# Patient Record
Sex: Male | Born: 1985
Health system: Southern US, Community
[De-identification: ages and names within clinical notes are randomized; demographics above are authoritative.]

## PROBLEM LIST (undated history)

## (undated) DIAGNOSIS — F419 Anxiety disorder, unspecified: Secondary | ICD-10-CM

## (undated) DIAGNOSIS — E039 Hypothyroidism, unspecified: Secondary | ICD-10-CM

## (undated) HISTORY — DX: Anxiety disorder, unspecified: F41.9

---

## 2005-02-12 ENCOUNTER — Emergency Department (HOSPITAL_COMMUNITY): Admission: EM | Admit: 2005-02-12 | Discharge: 2005-02-12 | Payer: Self-pay | Admitting: Emergency Medicine

## 2009-02-08 ENCOUNTER — Inpatient Hospital Stay: Payer: Self-pay | Admitting: Psychiatry

## 2009-02-09 ENCOUNTER — Ambulatory Visit: Payer: Self-pay | Admitting: Cardiology

## 2012-05-15 ENCOUNTER — Emergency Department: Payer: Self-pay | Admitting: Unknown Physician Specialty

## 2012-05-16 ENCOUNTER — Emergency Department: Payer: Self-pay | Admitting: Emergency Medicine

## 2012-05-26 ENCOUNTER — Emergency Department: Payer: Self-pay | Admitting: *Deleted

## 2017-02-06 ENCOUNTER — Emergency Department: Payer: Self-pay

## 2017-02-06 ENCOUNTER — Emergency Department
Admission: EM | Admit: 2017-02-06 | Discharge: 2017-02-06 | Disposition: A | Discharge status: Home / Self Care | Payer: Self-pay | Attending: Emergency Medicine | Admitting: Emergency Medicine

## 2017-02-06 ENCOUNTER — Encounter: Payer: Self-pay | Admitting: *Deleted

## 2017-02-06 DIAGNOSIS — F17210 Nicotine dependence, cigarettes, uncomplicated: Secondary | ICD-10-CM | POA: Insufficient documentation

## 2017-02-06 DIAGNOSIS — R42 Dizziness and giddiness: Secondary | ICD-10-CM | POA: Insufficient documentation

## 2017-02-06 LAB — CBC
HEMATOCRIT: 44.2 % (ref 40.0–52.0)
HEMOGLOBIN: 15 g/dL (ref 13.0–18.0)
MCH: 30.9 pg (ref 26.0–34.0)
MCHC: 34 g/dL (ref 32.0–36.0)
MCV: 91.1 fL (ref 80.0–100.0)
Platelets: 259 10*3/uL (ref 150–440)
RBC: 4.85 MIL/uL (ref 4.40–5.90)
RDW: 13.3 % (ref 11.5–14.5)
WBC: 7.7 10*3/uL (ref 3.8–10.6)

## 2017-02-06 LAB — URINALYSIS, COMPLETE (UACMP) WITH MICROSCOPIC
BACTERIA UA: NONE SEEN
Bilirubin Urine: NEGATIVE
Glucose, UA: NEGATIVE mg/dL
Hgb urine dipstick: NEGATIVE
Ketones, ur: 5 mg/dL — AB
Leukocytes, UA: NEGATIVE
Nitrite: NEGATIVE
PROTEIN: NEGATIVE mg/dL
Specific Gravity, Urine: 1.025 (ref 1.005–1.030)
Squamous Epithelial / LPF: NONE SEEN
pH: 6 (ref 5.0–8.0)

## 2017-02-06 LAB — BASIC METABOLIC PANEL
ANION GAP: 7 (ref 5–15)
BUN: 16 mg/dL (ref 6–20)
CALCIUM: 8.9 mg/dL (ref 8.9–10.3)
CO2: 27 mmol/L (ref 22–32)
Chloride: 104 mmol/L (ref 101–111)
Creatinine, Ser: 1.07 mg/dL (ref 0.61–1.24)
GFR calc Af Amer: >60 mL/min (ref >60)
GFR calc non Af Amer: >60 mL/min (ref >60)
GLUCOSE: 114 mg/dL — AB (ref 65–99)
POTASSIUM: 3.5 mmol/L (ref 3.5–5.1)
Sodium: 138 mmol/L (ref 135–145)

## 2017-02-06 MED ORDER — MECLIZINE HCL 25 MG PO TABS: 25.0000 mg | ORAL_TABLET | Freq: Three times a day (TID) | ORAL | 0 refills | Status: DC | PRN

## 2017-02-06 NOTE — ED Notes (Signed)
Pt c/o dizziness, usually when sitting. Pt states it is more often in morning after waking and when riding in a car. Pt c/o infrequent nausea w/ dizziness. Pt states he has had this x 3-4 months. Pt states no precipitating factors other than position change or seated position. Pt c/o intermittently blurred vision w/ dizziness. Pt c/o ringing in ears when dizzy that is intermittent. Pt in no acute distress at this time.

## 2017-02-06 NOTE — ED Provider Notes (Signed)
[facility] Emergency Department Provider Note   ____________________________________________    I have reviewed the triage vital signs and the nursing notes.   HISTORY  Chief Complaint Dizziness     HPI Austin Fletcher is a 31 y.o. male who complains of intermittent episodes of dizziness over the last 4 months. They seem to happen sporadically and he reports typically when sitting down. He describes a sensation of the room spinning briefly but seems to go away when he focuses on things. No ear pain or tinnitus. No fevers or chills. No chest pain or palpitations. No shortness of breath. He was seen at [facility] ED in the past for this. Currently feels quite well with no symptoms   History reviewed. No pertinent past medical history.  There are no active problems to display for this patient.   History reviewed. No pertinent surgical history.  Prior to Admission medications   Medication Sig Start Date End Date Taking? Authorizing Provider  meclizine (ANTIVERT) 25 MG tablet Take 1 tablet (25 mg total) by mouth 3 (three) times daily as needed for dizziness. 02/06/17   Jene Every, MD     Allergies Patient has no known allergies.  No family history on file.  Social History Social History  Substance Use Topics  . Smoking status: Current Every Day Smoker    Packs/day: 1.00    Types: Cigarettes  . Smokeless tobacco: Current User  . Alcohol use No    Review of Systems  Constitutional: No fever/chills Eyes: No visual changes.  ENT: NoNeck pain Cardiovascular: Denies chest pain. Respiratory: Denies shortness of breath. Gastrointestinal: No abdominal pain.  No nausea, no vomiting.    Musculoskeletal: Negative for back pain. Skin: Negative for rash. Neurological: Negative for headaches or focal weakness  10-point ROS otherwise negative.  ____________________________________________   PHYSICAL EXAM:  VITAL SIGNS: ED Triage Vitals  Enc  Vitals Group     BP 02/06/17 1908 123/80     Pulse Rate 02/06/17 1908 61     Resp 02/06/17 1908 18     Temp 02/06/17 1908 99.1 F (37.3 C)     Temp Source 02/06/17 1908 Oral     SpO2 02/06/17 1908 100 %     Weight 02/06/17 1909 170 lb (77.1 kg)     Height 02/06/17 1909 6' (1.829 m)     Head Circumference --      Peak Flow --      Pain Score --      Pain Loc --      Pain Edu? --      Excl. in GC? --     Constitutional: Alert and oriented. No acute distress. Pleasant and interactive Eyes: Conjunctivae are normal. PERRLA, EOMI Head: Atraumatic. Nose: No congestion/rhinnorhea. Mouth/Throat: Mucous membranes are moist.   Neck:  Painless ROM Cardiovascular: Normal rate, regular rhythm. Grossly normal heart sounds.  Good peripheral circulation. Respiratory: Normal respiratory effort.  No retractions. Lungs CTAB.  Genitourinary: deferred Musculoskeletal: No lower extremity tenderness nor edema.  Warm and well perfused Neurologic:  Normal speech and language. No gross focal neurologic deficits are appreciated. Cranial nerves II-12 are normal Skin:  Skin is warm, dry and intact. No rash noted. Psychiatric: Mood and affect are normal. Speech and behavior are normal.  ____________________________________________   LABS (all labs ordered are listed, but only abnormal results are displayed)  Labs Reviewed  BASIC METABOLIC PANEL - Abnormal; Notable for the following:  Result Value   Glucose, Bld 114 (*)    All other components within normal limits  URINALYSIS, COMPLETE (UACMP) WITH MICROSCOPIC - Abnormal; Notable for the following:    Color, Urine YELLOW (*)    APPearance CLEAR (*)    Ketones, ur 5 (*)    All other components within normal limits  CBC   ____________________________________________  EKG  ED ECG REPORT I, Jene Every, the attending physician, personally viewed and interpreted this ECG.  Date: 02/06/2017  Rhythm: normal sinus rhythm QRS Axis:  normal Intervals: normal ST/T Wave abnormalities: normal Conduction Disturbances: none Narrative Interpretation: unremarkable  ____________________________________________  RADIOLOGY  CT head unremarkable ____________________________________________   PROCEDURES  Procedure(s) performed: No    Critical Care performed: No ____________________________________________   INITIAL IMPRESSION / ASSESSMENT AND PLAN / ED COURSE  Pertinent labs & imaging results that were available during my care of the patient were reviewed by me and considered in my medical decision making (see chart for details).  Patient presents with intermittent episodes of dizziness over several months. Currently asymptomatic. Description is most consistent with vertigo. Lab work is reassuring, CT head is unremarkable. I will prescribe meclizine and encouraged follow-up with ENT for further workup of this    ____________________________________________   FINAL CLINICAL IMPRESSION(S) / ED DIAGNOSES  Final diagnoses:  Vertigo      NEW MEDICATIONS STARTED DURING THIS VISIT:  Discharge Medication List as of 02/06/2017  8:53 PM    START taking these medications   Details  meclizine (ANTIVERT) 25 MG tablet Take 1 tablet (25 mg total) by mouth 3 (three) times daily as needed for dizziness., Starting Mon 02/06/2017, Print         Note:  This document was prepared using Dragon voice recognition software and may include unintentional dictation errors.    Jene Every, MD 02/06/17 2140

## 2017-02-06 NOTE — ED Triage Notes (Signed)
Pt complains of intermittent dizziness for the last 3 months, pt complains of an occasional headache, pt denies any other symptoms

## 2017-08-01 ENCOUNTER — Emergency Department
Admission: EM | Admit: 2017-08-01 | Discharge: 2017-08-01 | Disposition: A | Discharge status: Home / Self Care | Payer: Self-pay | Attending: Emergency Medicine | Admitting: Emergency Medicine

## 2017-08-01 ENCOUNTER — Emergency Department: Payer: Self-pay

## 2017-08-01 ENCOUNTER — Encounter: Payer: Self-pay | Admitting: Emergency Medicine

## 2017-08-01 DIAGNOSIS — R42 Dizziness and giddiness: Secondary | ICD-10-CM | POA: Insufficient documentation

## 2017-08-01 DIAGNOSIS — H6123 Impacted cerumen, bilateral: Secondary | ICD-10-CM | POA: Insufficient documentation

## 2017-08-01 DIAGNOSIS — R079 Chest pain, unspecified: Secondary | ICD-10-CM | POA: Insufficient documentation

## 2017-08-01 DIAGNOSIS — F17210 Nicotine dependence, cigarettes, uncomplicated: Secondary | ICD-10-CM | POA: Insufficient documentation

## 2017-08-01 LAB — BASIC METABOLIC PANEL
Anion gap: 8 (ref 5–15)
BUN: 17 mg/dL (ref 6–20)
CALCIUM: 9.3 mg/dL (ref 8.9–10.3)
CO2: 28 mmol/L (ref 22–32)
CREATININE: 1.43 mg/dL — AB (ref 0.61–1.24)
Chloride: 104 mmol/L (ref 101–111)
GFR calc non Af Amer: >60 mL/min (ref >60)
Glucose, Bld: 102 mg/dL — ABNORMAL HIGH (ref 65–99)
Potassium: 4.1 mmol/L (ref 3.5–5.1)
SODIUM: 140 mmol/L (ref 135–145)

## 2017-08-01 LAB — CBC
HCT: 43.1 % (ref 40.0–52.0)
Hemoglobin: 14.7 g/dL (ref 13.0–18.0)
MCH: 30.1 pg (ref 26.0–34.0)
MCHC: 34.1 g/dL (ref 32.0–36.0)
MCV: 88.2 fL (ref 80.0–100.0)
PLATELETS: 242 10*3/uL (ref 150–440)
RBC: 4.89 MIL/uL (ref 4.40–5.90)
RDW: 13.3 % (ref 11.5–14.5)
WBC: 7.3 10*3/uL (ref 3.8–10.6)

## 2017-08-01 LAB — TROPONIN I

## 2017-08-01 MED ORDER — PENICILLIN G BENZATHINE & PROC 1200000 UNIT/2ML IM SUSP: 1.2000 10*6.[IU] | Freq: Once | INTRAMUSCULAR | Status: DC

## 2017-08-01 MED ORDER — KETOROLAC TROMETHAMINE 30 MG/ML IJ SOLN: 30.0000 mg | Freq: Once | INTRAMUSCULAR | Status: DC

## 2017-08-01 NOTE — ED Provider Notes (Addendum)
[facility] Emergency Department Provider Note  ____________________________________________   First MD Initiated Contact with Patient 08/01/17 2044     (approximate)  I have reviewed the triage vital signs and the nursing notes.   HISTORY  Chief Complaint Chest Pain   HPI Austin Fletcher is a 31 y.o. male with a history of recurrent chest pain as well as dizziness over the past year who is presenting to the emergency department today with chest pain and dizziness. He is denying any chest pain at this time and says that his dizziness, which feels like a spinning sensation, worsens when he walks. He says he has had this off and on over the past year and has tried meclizine which made him very drowsy. Has had a CAT scan of his brain which did not show any disease. Has also had chest pain over the past several months which she says is to the center of his chest and radiates to the left side of his chest. He says the pain comes and goes and can last for about a half an hour to an hour. He describes it as sharp and a twisting pain. He denies any worsening with activity. Says that it is sometimes associated with shortness of breath or nausea. However, he denies any vomiting. He says that the chest pain and the dizziness are separate issues and do not frequent become together. He denies any ringing in his ears, hearing loss or pressure in his ears bilaterally. He says that he has stopped smoking as of 2 months ago. However, he says that he has a brother who is 34 years old was at a heart attack.patient says that he is chest pain-free at this time.   History reviewed. No pertinent past medical history.  There are no active problems to display for this patient.   History reviewed. No pertinent surgical history.  Prior to Admission medications   Medication Sig Start Date End Date Taking? Authorizing Provider  meclizine (ANTIVERT) 25 MG tablet Take 1 tablet (25 mg total)  by mouth 3 (three) times daily as needed for dizziness. 02/06/17   Jene Every, MD    Allergies Patient has no known allergies.  History reviewed. No pertinent family history.  Social History Social History  Substance Use Topics  . Smoking status: Current Every Day Smoker    Packs/day: 1.00    Types: Cigarettes  . Smokeless tobacco: Current User  . Alcohol use No    Review of Systems  Constitutional: No fever/chills Eyes: No visual changes. ENT: No sore throat. Cardiovascular:as above. Respiratory: as above Gastrointestinal: No abdominal pain.   no vomiting.  No diarrhea.  No constipation. Genitourinary: Negative for dysuria. Musculoskeletal: Negative for back pain. Skin: Negative for rash. Neurological: Negative for headaches, focal weakness or numbness.   ____________________________________________   PHYSICAL EXAM:  VITAL SIGNS: ED Triage Vitals [08/01/17 2026]  Enc Vitals Group     BP 129/90     Pulse Rate 60     Resp 16     Temp 98 F (36.7 C)     Temp Source Oral     SpO2 99 %     Weight 170 lb (77.1 kg)     Height      Head Circumference      Peak Flow      Pain Score      Pain Loc      Pain Edu?      Excl. in GC?  Constitutional: Alert and oriented. Well appearing and in no acute distress. Eyes: Conjunctivae are normal.  Head: Atraumatic.bilateral cerumen impaction. Nose: No congestion/rhinnorhea. Mouth/Throat: Mucous membranes are moist.  Neck: No stridor.   Cardiovascular: Normal rate, regular rhythm. Grossly normal heart sounds.  Good peripheral circulation with equal and bilateral radial as well as dorsalis pedis pulses. Chest pain is not reproducible palpation. Respiratory: Normal respiratory effort.  No retractions. Lungs CTAB. Gastrointestinal: Soft and nontender. No distention.  Musculoskeletal: No lower extremity tenderness nor edema.  No joint effusions. Neurologic:  Normal speech and language. No gross focal neurologic deficits  are appreciated.patient able to ambulate throughout his exam room with a normal gait, unassisted.no nystagmus. No ataxia on finger to nose testing. Skin:  Skin is warm, dry and intact. No rash noted. Psychiatric: Mood and affect are normal. Speech and behavior are normal.  ____________________________________________   LABS (all labs ordered are listed, but only abnormal results are displayed)  Labs Reviewed  BASIC METABOLIC PANEL - Abnormal; Notable for the following:       Result Value   Glucose, Bld 102 (*)    Creatinine, Ser 1.43 (*)    All other components within normal limits  CBC  TROPONIN I   ____________________________________________  EKG  ED ECG REPORT I, Arelia Longest, the attending physician, personally viewed and interpreted this ECG.   Date: 08/01/2017  EKG Time: 20/30  Rate: 49  Rhythm: sinus bradycardia  Axis: normal  Intervals:none  ST&T Change: no ST segment elevation or depression. No abnormal T-wave inversion. voltage criteria for LVH. no significant change from previous EKGs  ____________________________________________  RADIOLOGY  no acute process ____________________________________________   PROCEDURES  Procedure(s) performed:  Ceruminosis is noted.  Wax is removed by syringing and manual debridement bilaterally. Instructions for home care to prevent wax buildup are given.   Procedures  Critical Care performed:   ____________________________________________   INITIAL IMPRESSION / ASSESSMENT AND PLAN / ED COURSE  Pertinent labs & imaging results that were available during my care of the patient were reviewed by me and considered in my medical decision making (see chart for details).  PERC negative.    patient with ongoing symptoms for months to years. Very reassuring workup. Bradycardic but without ischemic change and reassuring lab work, x-ray as well as exam. Patient will be discharged with cardiology as well as ENT  follow-up. Small amount of bleeding with wax removal appears to have stopped.cerumen impactions possible cause for the vertigo. patient says that he is concerned that he may be having heartburn causing his chest pain. He says that sometimes he belches it becomes nauseous with it. We will try him on Zantac.      ____________________________________________   FINAL CLINICAL IMPRESSION(S) / ED DIAGNOSES  vertigo. Chest pain.Cerumen impaction.    NEW MEDICATIONS STARTED DURING THIS VISIT:  New Prescriptions   No medications on file     Note:  This document was prepared using Dragon voice recognition software and may include unintentional dictation errors.      Myrna Blazer, MD 08/01/17 2147    Myrna Blazer, MD 08/01/17 [phone]

## 2017-08-01 NOTE — ED Triage Notes (Signed)
Pt c/o intermittent left sided chest pain that radiates into left arm, occurring over 1 year and in the last "dew" days has worsened with intensity and is occurring more frequent. Pt denies SOB, N/V with chest pain episodes. Pt reports episodes of dizziness in last 24 hours. Pt has been seen for symptoms and had work ups in past for current symptoms.

## 2017-08-20 ENCOUNTER — Encounter: Payer: Self-pay | Admitting: Emergency Medicine

## 2017-08-20 ENCOUNTER — Emergency Department: Payer: Self-pay

## 2017-08-20 ENCOUNTER — Emergency Department
Admission: EM | Admit: 2017-08-20 | Discharge: 2017-08-20 | Disposition: A | Discharge status: Home / Self Care | Payer: Self-pay | Attending: Emergency Medicine | Admitting: Emergency Medicine

## 2017-08-20 DIAGNOSIS — Z79899 Other long term (current) drug therapy: Secondary | ICD-10-CM | POA: Insufficient documentation

## 2017-08-20 DIAGNOSIS — R079 Chest pain, unspecified: Secondary | ICD-10-CM | POA: Insufficient documentation

## 2017-08-20 DIAGNOSIS — F17210 Nicotine dependence, cigarettes, uncomplicated: Secondary | ICD-10-CM | POA: Insufficient documentation

## 2017-08-20 LAB — CBC
HEMATOCRIT: 47.2 % (ref 40.0–52.0)
Hemoglobin: 15.9 g/dL (ref 13.0–18.0)
MCH: 30.3 pg (ref 26.0–34.0)
MCHC: 33.6 g/dL (ref 32.0–36.0)
MCV: 90 fL (ref 80.0–100.0)
PLATELETS: 252 10*3/uL (ref 150–440)
RBC: 5.24 MIL/uL (ref 4.40–5.90)
RDW: 13.9 % (ref 11.5–14.5)
WBC: 6.6 10*3/uL (ref 3.8–10.6)

## 2017-08-20 LAB — BASIC METABOLIC PANEL
Anion gap: 6 (ref 5–15)
BUN: 19 mg/dL (ref 6–20)
CHLORIDE: 105 mmol/L (ref 101–111)
CO2: 29 mmol/L (ref 22–32)
CREATININE: 1.33 mg/dL — AB (ref 0.61–1.24)
Calcium: 9.3 mg/dL (ref 8.9–10.3)
GFR calc Af Amer: >60 mL/min (ref >60)
GFR calc non Af Amer: >60 mL/min (ref >60)
GLUCOSE: 124 mg/dL — AB (ref 65–99)
POTASSIUM: 4.2 mmol/L (ref 3.5–5.1)
SODIUM: 140 mmol/L (ref 135–145)

## 2017-08-20 LAB — TROPONIN I: Troponin I: <0.03 ng/mL (ref <0.03)

## 2017-08-20 NOTE — ED Triage Notes (Signed)
Pt c/o intermittent left side chest pain X 2 months. Afraid having heart attack because brother did at young age. Been seen here and [facility] for same without dx.  NAD. ambulatory without difficulty. Respirations unlabored.

## 2017-08-20 NOTE — ED Notes (Addendum)
Pt back from x-ray.

## 2017-08-20 NOTE — ED Provider Notes (Signed)
[facility] Emergency Department Provider Note  ____________________________________________   First MD Initiated Contact with Patient 08/20/17 1601     (approximate)  I have reviewed the triage vital signs and the nursing notes.   HISTORY  Chief Complaint Chest Pain   HPI Austin Fletcher is a 31 y.o. male with a family history of heart disease was presenting to the emergency department today with chest pain. He has had episodes of chest pain on and off over the past year. He has had multiple ER visits including to this ER with very reassuring workups. However, is concerned today because of shortness of breath over the past week which comes and goes with this chest pain. He says that his chest pain is a 4 out of 10 at this time and feels like a squeezing pain on the left side of his chest radiating to the center of his chest. He denies any back pain or left upper extremity pain. He denies any exacerbating factors and says that the pain can come and go when he is at rest or active. He denies any diaphoresis. Says that the chest pain this week also feels like "something is pumping into my heart." He elaborates that it feels like "something feels like it is pumping blood into my heart."  Also complaining of throat pain to the service of his throat just to the right of his thyroid cartilage. Denies any trauma. Denies any cough.   History reviewed. No pertinent past medical history.  There are no active problems to display for this patient.   History reviewed. No pertinent surgical history.  Prior to Admission medications   Medication Sig Start Date End Date Taking? Authorizing Provider  meclizine (ANTIVERT) 25 MG tablet Take 1 tablet (25 mg total) by mouth 3 (three) times daily as needed for dizziness. 02/06/17   Jene Every, MD    Allergies Patient has no known allergies.  History reviewed. No pertinent family history.  Social History Social History    Substance Use Topics  . Smoking status: Current Every Day Smoker    Packs/day: 1.00    Types: Cigarettes  . Smokeless tobacco: Current User     Comment: states stopped 3 weeks ago  . Alcohol use No    Review of Systems  Constitutional: No fever/chills Eyes: No visual changes. ENT:as above Cardiovascular: as above Respiratory: Denies shortness of breath. Gastrointestinal: No abdominal pain.  No nausea, no vomiting.  No diarrhea.  No constipation. Genitourinary: Negative for dysuria. Musculoskeletal: Negative for back pain. Skin: Negative for rash. Neurological: Negative for headaches, focal weakness or numbness.   ____________________________________________   PHYSICAL EXAM:  VITAL SIGNS: ED Triage Vitals  Enc Vitals Group     BP 08/20/17 1536 (!) 144/80     Pulse Rate 08/20/17 1536 60     Resp 08/20/17 1536 12     Temp 08/20/17 1536 98.4 F (36.9 C)     Temp Source 08/20/17 1536 Oral     SpO2 08/20/17 1536 100 %     Weight 08/20/17 1529 170 lb (77.1 kg)     Height --      Head Circumference --      Peak Flow --      Pain Score 08/20/17 1530 6     Pain Loc --      Pain Edu? --      Excl. in GC? --     Constitutional: Alert and oriented. Well appearing and in  no acute distress. Eyes: Conjunctivae are normal.  Head: Atraumatic. Nose: No congestion/rhinnorhea. Mouth/Throat: Mucous membranes are moist. normal pharyngeal exam without any swelling or erythema. Neck: No stridor.  small amount of ecchymosis over the right side of the throat just lateral to the thyroid cartilage. Cardiovascular: Normal rate, regular rhythm. Grossly normal heart sounds.  Good peripheral circulation with equal and bilateral radial pulses. Chest pain is not reproducible palpation. Respiratory: Normal respiratory effort.  No retractions. Lungs CTAB. Gastrointestinal: Soft and nontender. No distention.  Musculoskeletal: No lower extremity tenderness nor edema.  No joint  effusions. Neurologic:  Normal speech and language. No gross focal neurologic deficits are appreciated. Skin:  Skin is warm, dry and intact. No rash noted. Psychiatric: Mood and affect are normal. Speech and behavior are normal.  ____________________________________________   LABS (all labs ordered are listed, but only abnormal results are displayed)  Labs Reviewed  BASIC METABOLIC PANEL - Abnormal; Notable for the following:       Result Value   Glucose, Bld 124 (*)    Creatinine, Ser 1.33 (*)    All other components within normal limits  CBC  TROPONIN I   ____________________________________________  EKG  ED ECG REPORT I, Arelia Longest, the attending physician, personally viewed and interpreted this ECG.   Date: 08/20/2017  EKG Time: 1526  Rate: 55  Rhythm: sinus bradycardia  Axis: rightward  Intervals:none  ST&T Change: no ST segment elevation or depression. No abnormal T-wave inversion.been changed from previous.  ____________________________________________  RADIOLOGY  no acute finding on the patient's chest x-ray. ____________________________________________   PROCEDURES  Procedure(s) performed:   Procedures  Critical Care performed:   ____________________________________________   INITIAL IMPRESSION / ASSESSMENT AND PLAN / ED COURSE  Pertinent labs & imaging results that were available during my care of the patient were reviewed by me and considered in my medical decision making (see chart for details).  Differential diagnosis includes, but is not limited to, ACS, aortic dissection, pulmonary embolism, cardiac tamponade, pneumothorax, pneumonia, pericarditis/myocarditis, GI-related causes including esophagitis/gastritis, and musculoskeletal chest wall pain.    As part of my medical decision making, I reviewed the following data within the electronic MEDICAL RECORD NUMBER Old EKG reviewed and Notes from prior ED visits   patient with ongoing  symptoms for about one year. Consistently with reassuring emergency department workups. He is accompanied by his brother here today. I expressed again to the patient and the brother as needed follow-up with cardiology. He says that he is scheduled to follow-up appointment with her nose and throat in November for his recurrent dizziness. However, he has not attempted to call cardiology. We discussed the need for this as he will likely need an echocardiogram as well as a stress test. The patient and his brother said they will call first thing tomorrow morning for an urgent follow-up appointment within the next 48-72 hours. Patient to return for any worsening or concerning symptoms. Patient appears well this time.Unchanged from his previous labs or EKG.  PERC negative.    Heart score of 2.   patient understanding the plan want to comply. Patient denies any trauma to his throat where he has ecchymosis. However, it appears that he is bruised there from some unknown neck and is him. Normal platelets. Normal pharyngeal exam. Controlling secretions and speaking with a normal voice.       ____________________________________________   FINAL CLINICAL IMPRESSION(S) / ED DIAGNOSES  chest pain.    NEW MEDICATIONS STARTED DURING THIS  VISIT:  New Prescriptions   No medications on file     Note:  This document was prepared using Dragon voice recognition software and may include unintentional dictation errors.     Myrna Blazer, MD 08/20/17 [phone]

## 2017-08-22 ENCOUNTER — Other Ambulatory Visit: Payer: Self-pay | Admitting: Internal Medicine

## 2017-08-22 DIAGNOSIS — I208 Other forms of angina pectoris: Secondary | ICD-10-CM

## 2017-08-22 DIAGNOSIS — R0602 Shortness of breath: Secondary | ICD-10-CM

## 2017-08-28 ENCOUNTER — Ambulatory Visit
Admission: RE | Admit: 2017-08-28 | Discharge: 2017-08-28 | Disposition: A | Discharge status: Home / Self Care | Payer: Self-pay | Source: Ambulatory Visit | Attending: Internal Medicine | Admitting: Internal Medicine

## 2017-08-28 DIAGNOSIS — R0602 Shortness of breath: Secondary | ICD-10-CM | POA: Insufficient documentation

## 2017-08-28 DIAGNOSIS — I208 Other forms of angina pectoris: Secondary | ICD-10-CM | POA: Insufficient documentation

## 2017-08-28 LAB — NM MYOCAR MULTI W/SPECT W/WALL MOTION / EF
CHL CUP NUCLEAR SRS: 0
CHL CUP NUCLEAR SSS: 0
Estimated workload: 10.1 METS
Exercise duration (min): 8 min
Exercise duration (sec): 59 s
LV dias vol: 70 mL (ref 62–150)
LVSYSVOL: 31 mL
MPHR: 188 {beats}/min
NUC STRESS TID: 0.94
Peak HR: 181 {beats}/min
Percent HR: 96 %
Rest HR: 75 {beats}/min
SDS: 0

## 2017-08-28 MED ORDER — TECHNETIUM TC 99M TETROFOSMIN IV KIT
30.0000 | PACK | Freq: Once | INTRAVENOUS | Status: AC | PRN
  Administered 2017-08-28: 29.31 via INTRAVENOUS

## 2017-08-28 MED ORDER — TECHNETIUM TC 99M TETROFOSMIN IV KIT
13.0000 | PACK | Freq: Once | INTRAVENOUS | Status: AC | PRN
  Administered 2017-08-28: 12.49 via INTRAVENOUS

## 2017-08-28 NOTE — Progress Notes (Signed)
*  PRELIMINARY RESULTS* Echocardiogram 2D Echocardiogram has been performed.  Austin Fletcher, Austin Fletcher 08/28/2017, 11:18 AM

## 2017-09-21 ENCOUNTER — Encounter: Payer: Self-pay | Admitting: Emergency Medicine

## 2017-09-21 ENCOUNTER — Emergency Department: Payer: Self-pay

## 2017-09-21 ENCOUNTER — Emergency Department
Admission: EM | Admit: 2017-09-21 | Discharge: 2017-09-21 | Disposition: A | Discharge status: Home / Self Care | Payer: Self-pay | Attending: Emergency Medicine | Admitting: Emergency Medicine

## 2017-09-21 ENCOUNTER — Emergency Department
Admission: EM | Admit: 2017-09-21 | Discharge: 2017-09-21 | Disposition: A | Discharge status: Home / Self Care | Payer: Self-pay | Attending: Student in an Organized Health Care Education/Training Program | Admitting: Student in an Organized Health Care Education/Training Program

## 2017-09-21 DIAGNOSIS — F17210 Nicotine dependence, cigarettes, uncomplicated: Secondary | ICD-10-CM | POA: Insufficient documentation

## 2017-09-21 DIAGNOSIS — F419 Anxiety disorder, unspecified: Secondary | ICD-10-CM | POA: Insufficient documentation

## 2017-09-21 DIAGNOSIS — E039 Hypothyroidism, unspecified: Secondary | ICD-10-CM | POA: Insufficient documentation

## 2017-09-21 LAB — CBC
HEMATOCRIT: 45.8 % (ref 40.0–52.0)
Hemoglobin: 15.1 g/dL (ref 13.0–18.0)
MCH: 29.8 pg (ref 26.0–34.0)
MCHC: 33 g/dL (ref 32.0–36.0)
MCV: 90.5 fL (ref 80.0–100.0)
Platelets: 259 10*3/uL (ref 150–440)
RBC: 5.06 MIL/uL (ref 4.40–5.90)
RDW: 13.7 % (ref 11.5–14.5)
WBC: 6 10*3/uL (ref 3.8–10.6)

## 2017-09-21 LAB — BASIC METABOLIC PANEL
Anion gap: 9 (ref 5–15)
BUN: 20 mg/dL (ref 6–20)
CO2: 25 mmol/L (ref 22–32)
Calcium: 8.8 mg/dL — ABNORMAL LOW (ref 8.9–10.3)
Chloride: 106 mmol/L (ref 101–111)
Creatinine, Ser: 1.11 mg/dL (ref 0.61–1.24)
GFR calc Af Amer: >60 mL/min (ref >60)
GLUCOSE: 112 mg/dL — AB (ref 65–99)
POTASSIUM: 3.5 mmol/L (ref 3.5–5.1)
SODIUM: 140 mmol/L (ref 135–145)

## 2017-09-21 LAB — TROPONIN I: Troponin I: <0.03 ng/mL (ref <0.03)

## 2017-09-21 LAB — TSH: TSH: 5.263 u[IU]/mL — AB (ref 0.350–4.500)

## 2017-09-21 MED ORDER — LEVOTHYROXINE SODIUM 112 MCG PO TABS: 112.0000 ug | ORAL_TABLET | Freq: Every day | ORAL | 0 refills | Status: DC

## 2017-09-21 NOTE — ED Triage Notes (Signed)
Pt c/o sudden onset of left sided chest pain this AM accompanied by chills. Pt denies SOB, N/V. Pt seen cardiologist and had echo and stress test in last month with normal findings.

## 2017-09-21 NOTE — ED Provider Notes (Signed)
[facility] Emergency Department Provider Note  ____________________________________________  Time seen: Approximately 6:17 PM  I have reviewed the triage vital signs and the nursing notes.   HISTORY  Chief Complaint other (pt wanting medication for thyroid)    HPI April HoldingJoseph J Fletcher is a 31 y.o. male presents to the emergency department with concern for not being able to establish care with a primary care provider regarding newly diagnosed hypothyroidism.  Patient reports that he has called 5 primary care providers [place] and he will have to wait approximately 2-3 weeks to be seen.  Patient was diagnosed with hypothyroidism by Dr. Manson PasseyBrown 1 day ago at [facility].  Patient is requesting a short supply of "thyroid medication" until he can be seen by his PCP.  Patient is also struggling to find a primary care provider who will see him without insurance and patient reports that he is having financial difficulty paying the co-pay.  He denies current chest pain, chest tightness, shortness of breath, nausea, vomiting or abdominal pain.   History reviewed. No pertinent past medical history.  There are no active problems to display for this patient.   History reviewed. No pertinent surgical history.  Prior to Admission medications   Medication Sig Start Date End Date Taking? Authorizing Provider  levothyroxine (SYNTHROID) 112 MCG tablet Take 1 tablet (112 mcg total) daily by mouth. 09/21/17 10/21/17  Orvil FeilWoods, Undrea Archbold M, PA-C  meclizine (ANTIVERT) 25 MG tablet Take 1 tablet (25 mg total) by mouth 3 (three) times daily as needed for dizziness. 02/06/17   Jene EveryKinner, Robert, MD    Allergies Patient has no known allergies.  No family history on file.  Social History Social History   Tobacco Use  . Smoking status: Current Every Day Smoker    Packs/day: 1.00    Types: Cigarettes  . Smokeless tobacco: Current User  . Tobacco comment:  states stopped 3 weeks ago  Substance Use Topics  . Alcohol use: No  . Drug use: Not on file     Review of Systems  Constitutional: No fever/chills Eyes: No visual changes. No discharge ENT: No upper respiratory complaints. Cardiovascular: no chest pain. Respiratory: no cough. No SOB. Gastrointestinal: No abdominal pain.  No nausea, no vomiting.  No diarrhea.  No constipation. Genitourinary: Negative for dysuria. No hematuria Musculoskeletal: Negative for musculoskeletal pain. Skin: Negative for rash, abrasions, lacerations, ecchymosis. Neurological: Negative for headaches, focal weakness or numbness.   ____________________________________________   PHYSICAL EXAM:  VITAL SIGNS: ED Triage Vitals  Enc Vitals Group     BP 09/21/17 1716 117/73     Pulse Rate 09/21/17 1716 62     Resp 09/21/17 1716 16     Temp 09/21/17 1716 98.4 F (36.9 C)     Temp Source 09/21/17 1716 Oral     SpO2 09/21/17 1716 99 %     Weight 09/21/17 1716 170 lb (77.1 kg)     Height --      Head Circumference --      Peak Flow --      Pain Score 09/21/17 1715 1     Pain Loc --      Pain Edu? --      Excl. in GC? --      Constitutional: Alert and oriented. Well appearing and in no acute distress. Eyes: Conjunctivae are normal. PERRL. EOMI. Head: Atraumatic.      Nose: No congestion/rhinnorhea.      Mouth/Throat: Mucous membranes  are moist.  Cardiovascular: Normal rate, regular rhythm. Normal S1 and S2.  Good peripheral circulation. Respiratory: Normal respiratory effort without tachypnea or retractions. Lungs CTAB. Good air entry to the bases with no decreased or absent breath sounds. Gastrointestinal: Bowel sounds 4 quadrants. Soft and nontender to palpation. No guarding or rigidity. No palpable masses. No distention. No CVA tenderness. Musculoskeletal: Full range of motion to all extremities. No gross deformities appreciated. Neurologic:  Normal speech and language. No gross focal neurologic  deficits are appreciated.  Skin:  Skin is warm, dry and intact. No rash noted. Psychiatric: Mood and affect are normal. Speech and behavior are normal. Patient exhibits appropriate insight and judgement.   ____________________________________________   LABS (all labs ordered are listed, but only abnormal results are displayed)  Labs Reviewed - No data to display ____________________________________________  EKG   ____________________________________________  RADIOLOGY Geraldo PitterI, Nathanuel Cabreja M Joyann Spidle, personally viewed and evaluated these images (plain radiographs) as part of my medical decision making, as well as reviewing the written report by the radiologist.  Dg Chest 2 View  Result Date: 09/21/2017 CLINICAL DATA:  31 year old male with chest pain. EXAM: CHEST  2 VIEW COMPARISON:  Chest radiograph dated 08/20/2017 FINDINGS: The heart size and mediastinal contours are within normal limits. Both lungs are clear. The visualized skeletal structures are unremarkable. IMPRESSION: No active cardiopulmonary disease. Electronically Signed   By: Elgie CollardArash  Radparvar M.D.   On: 09/21/2017 03:15    ____________________________________________    PROCEDURES  Procedure(s) performed:    Procedures    Medications - No data to display   ____________________________________________   INITIAL IMPRESSION / ASSESSMENT AND PLAN / ED COURSE  Pertinent labs & imaging results that were available during my care of the patient were reviewed by me and considered in my medical decision making (see chart for details).  Review of the [place] CSRS was performed in accordance of the [facility] prior to dispensing any controlled drugs.    Assessment and plan Hypothyroidism Patient presents to the with a request for a short supply of Synthroid until he can be seen by his PCP.  As patient cannot establish care with a primary care provider for several weeks, I prescribed patient a 30-day supply of Synthroid.  Strict return  precautions were given to return to the emergency department for new or worsening symptoms.  Patient voiced understanding regarding this recommendation.  Patient was advised to establish care with a primary care provider as soon as possible and that refills of Synthroid would not be prescribed again at a subsequent emergency department encounter.  All patient questions were answered.    ____________________________________________  FINAL CLINICAL IMPRESSION(S) / ED DIAGNOSES  Final diagnoses:  Hypothyroidism, unspecified type      NEW MEDICATIONS STARTED DURING THIS VISIT:  ED Discharge Orders        Ordered    levothyroxine (SYNTHROID) 112 MCG tablet  Daily     09/21/17 1809          This chart was dictated using voice recognition software/Dragon. Despite best efforts to proofread, errors can occur which can change the meaning. Any change was purely unintentional.    Orvil FeilWoods, Lecretia Buczek M, PA-C 09/21/17 1821    Willy Eddyobinson, Patrick, MD 09/21/17 2150

## 2017-09-21 NOTE — ED Notes (Signed)
ED Provider at bedside. 

## 2017-09-21 NOTE — ED Notes (Signed)
Pt to the er for body feeling hot and shaking for 25 minutes. Pt was sleeping and woke up to body shaking. I asked pt if he did anything to help and he said he went outside to cool off to see if that would. No HX of seizures. When asked if pt had any medical issues, pt denies. Pt reports some mild chest pain when he was shaking. No chest pain at this time. Pt reports one prior incidence.

## 2017-09-21 NOTE — ED Notes (Signed)
Family at bedside. 

## 2017-09-21 NOTE — ED Triage Notes (Signed)
Pt states he was seen yesterday and referred to see a PCP for thyroid medication.  Per pt symptoms are not worse, but he was unable to get in with a PCP today and states most are 2-3 weeks booked out.  Pt wanting thyroid medication to be started by ER physician instead.  Pt A&Ox4, in NAD, ambulatory to triage.

## 2017-09-21 NOTE — ED Notes (Signed)
Pt reports that he was here yesterday and was referred to a PCP for thyroid medication. He states he cant get to PCP and wants ER MD to fill his RX.

## 2017-09-21 NOTE — ED Notes (Signed)
Pt ambulatory to POV with family without difficulty. NAD. VSS. Skin PWD. Resp easy and non-labored. No questions or concerns voiced during discharge instructions.

## 2017-09-23 NOTE — ED Provider Notes (Signed)
[facility] Emergency Department Provider Note   First MD Initiated Contact with Patient 09/21/17 0330     (approximate)  I have reviewed the triage vital signs and the nursing notes.   HISTORY  Chief Complaint Chest Pain    HPI [facility] is a 31 y.o. male presents to the emergency department with generalized "shaking" on awakening this morning that lasted approximately 35-40 minutes followed by spontaneous resolution.  Patient states that he was aware of the shaking did not lose consciousness.  Patient denied any chest pain no shortness of breath no headache no change in vision no weakness or numbness.  She does admit to previous left-sided chest pain for which she is being evaluated by cardiology.  No chest pain at present.  Patient states that he has been seen for previous episodes like [facility] and diagnosed with anxiety   Past medical history None There are no active problems to display for this patient.   Past surgical history None  Prior to Admission medications   Medication Sig Start Date End Date Taking? Authorizing Provider  levothyroxine (SYNTHROID) 112 MCG tablet Take 1 tablet (112 mcg total) daily by mouth. 09/21/17 10/21/17  Orvil FeilWoods, Jaclyn M, PA-C  meclizine (ANTIVERT) 25 MG tablet Take 1 tablet (25 mg total) by mouth 3 (three) times daily as needed for dizziness. 02/06/17   Jene EveryKinner, Robert, MD    Allergies No known drug allergies History reviewed. No pertinent family history.  Social History Social History   Tobacco Use  . Smoking status: Current Every Day Smoker    Packs/day: 1.00    Types: Cigarettes  . Smokeless tobacco: Current User  . Tobacco comment: states stopped 3 weeks ago  Substance Use Topics  . Alcohol use: No  . Drug use: Not on file    Review of Systems Constitutional: No fever/chills Eyes: No visual changes. ENT: No sore throat. Cardiovascular: Denies chest pain. Respiratory: Denies  shortness of breath. Gastrointestinal: No abdominal pain.  No nausea, no vomiting.  No diarrhea.  No constipation. Genitourinary: Negative for dysuria. Musculoskeletal: Negative for neck pain.  Negative for back pain. Integumentary: Negative for rash. Neurological: Negative for headaches, focal weakness or numbness.   ____________________________________________   PHYSICAL EXAM:  VITAL SIGNS: ED Triage Vitals [09/21/17 0230]  Enc Vitals Group     BP (!) 118/97     Pulse Rate 84     Resp 16     Temp 97.6 F (36.4 C)     Temp Source Oral     SpO2 100 %     Weight 77.1 kg (170 lb)     Height      Head Circumference      Peak Flow      Pain Score      Pain Loc      Pain Edu?      Excl. in GC?     Constitutional: Alert and oriented. Well appearing and in no acute distress. Eyes: Conjunctivae are normal. PERRL. EOMI. Head: Atraumatic. Mouth/Throat: Mucous membranes are moist.  Oropharynx non-erythematous. Neck: No stridor.   Cardiovascular: Normal rate, regular rhythm. Good peripheral circulation. Grossly normal heart sounds. Respiratory: Normal respiratory effort.  No retractions. Lungs CTAB. Gastrointestinal: Soft and nontender. No distention.  Musculoskeletal: No lower extremity tenderness nor edema. No gross deformities of extremities. Neurologic:  Normal speech and language. No gross focal neurologic deficits are appreciated.  Skin:  Skin is warm, dry and intact. No  rash noted. Psychiatric: Mood and affect are normal. Speech and behavior are normal.  ____________________________________________   LABS (all labs ordered are listed, but only abnormal results are displayed)  Labs Reviewed  BASIC METABOLIC PANEL - Abnormal; Notable for the following components:      Result Value   Glucose, Bld 112 (*)    Calcium 8.8 (*)    All other components within normal limits  TSH - Abnormal; Notable for the following components:   TSH 5.263 (*)    All other components  within normal limits  CBC  TROPONIN I   ____________________________________________  EKG  Time: 2:30 AM Rate:81 Rhythm:Normal Sinus Rhythm Axis: Normal Interval: Normal ST segment T wave: No ST segment or T wave abnormalities noted ____________________________________________    Procedures   ____________________________________________   INITIAL IMPRESSION / ASSESSMENT AND PLAN / ED COURSE  As part of my medical decision making, I reviewed the following data within the electronic MEDICAL RECORD [id number] year old male presented with above-stated history and physical exam EKG revealed no acute abnormality laboratory data unremarkable.  Considered anxiety as etiology of the patient's symptoms considered medical etiologies and as such thyroid was assessed which revealed a TSH of 5.2.  Patient notified of all clinical findings     ____________________________________________  FINAL CLINICAL IMPRESSION(S) / ED DIAGNOSES  Final diagnoses:  Hypothyroidism, unspecified type  Anxiety     MEDICATIONS GIVEN DURING THIS VISIT:  Medications - No data to display   ED Discharge Orders    None       Note:  This document was prepared using Dragon voice recognition software and may include unintentional dictation errors.    Darci CurrentBrown, Ringwood N, MD 09/23/17 [phone]

## 2017-10-04 ENCOUNTER — Emergency Department
Admission: EM | Admit: 2017-10-04 | Discharge: 2017-10-04 | Disposition: A | Discharge status: Home / Self Care | Payer: Self-pay | Attending: Emergency Medicine | Admitting: Emergency Medicine

## 2017-10-04 ENCOUNTER — Encounter: Payer: Self-pay | Admitting: Emergency Medicine

## 2017-10-04 ENCOUNTER — Emergency Department: Payer: Self-pay

## 2017-10-04 DIAGNOSIS — R079 Chest pain, unspecified: Secondary | ICD-10-CM | POA: Insufficient documentation

## 2017-10-04 DIAGNOSIS — F17210 Nicotine dependence, cigarettes, uncomplicated: Secondary | ICD-10-CM | POA: Insufficient documentation

## 2017-10-04 DIAGNOSIS — R110 Nausea: Secondary | ICD-10-CM | POA: Insufficient documentation

## 2017-10-04 DIAGNOSIS — R0602 Shortness of breath: Secondary | ICD-10-CM | POA: Insufficient documentation

## 2017-10-04 LAB — BASIC METABOLIC PANEL
ANION GAP: 9 (ref 5–15)
BUN: 19 mg/dL (ref 6–20)
CO2: 25 mmol/L (ref 22–32)
Calcium: 9 mg/dL (ref 8.9–10.3)
Chloride: 106 mmol/L (ref 101–111)
Creatinine, Ser: 1.26 mg/dL — ABNORMAL HIGH (ref 0.61–1.24)
GFR calc Af Amer: >60 mL/min (ref >60)
Glucose, Bld: 97 mg/dL (ref 65–99)
POTASSIUM: 3.9 mmol/L (ref 3.5–5.1)
SODIUM: 140 mmol/L (ref 135–145)

## 2017-10-04 LAB — CBC
HEMATOCRIT: 44.1 % (ref 40.0–52.0)
Hemoglobin: 14.9 g/dL (ref 13.0–18.0)
MCH: 30.6 pg (ref 26.0–34.0)
MCHC: 33.7 g/dL (ref 32.0–36.0)
MCV: 90.6 fL (ref 80.0–100.0)
Platelets: 223 10*3/uL (ref 150–440)
RBC: 4.87 MIL/uL (ref 4.40–5.90)
RDW: 13.6 % (ref 11.5–14.5)
WBC: 5.7 10*3/uL (ref 3.8–10.6)

## 2017-10-04 LAB — TROPONIN I: Troponin I: <0.03 ng/mL (ref <0.03)

## 2017-10-04 MED ORDER — SUCRALFATE 1 G PO TABS: 1.0000 g | ORAL_TABLET | Freq: Two times a day (BID) | ORAL | 0 refills | Status: DC

## 2017-10-04 MED ORDER — GI COCKTAIL ~~LOC~~
30.0000 mL | Freq: Once | ORAL | Status: AC
  Administered 2017-10-04: 30 mL via ORAL
  Filled 2017-10-04: qty 30

## 2017-10-04 MED ORDER — KETOROLAC TROMETHAMINE 30 MG/ML IJ SOLN
30.0000 mg | Freq: Once | INTRAMUSCULAR | Status: AC
  Administered 2017-10-04: 30 mg via INTRAVENOUS
  Filled 2017-10-04: qty 1

## 2017-10-04 NOTE — ED Triage Notes (Signed)
Pt c/o left sided chest pain as well as chills, starting approximately 1 hour ago. Pt denies SOB, N/V. Pt reports he was started on thyroid medication on 11/15 due to multiple ED trips for same symptoms as today and was found to have elevated thyroid levels. Per pt he has not had relief from medication.

## 2017-10-04 NOTE — ED Notes (Signed)
This RN to bedside to introduce self to patient/family. Pt visualized in NAD at this time. Pt denies any pain. Will continue to monitor for further patient needs. VSS and WNL.

## 2017-10-04 NOTE — ED Provider Notes (Signed)
[facility] Emergency Department Provider Note   ____________________________________________   First MD Initiated Contact with Patient 10/04/17 [phone]     (approximate)  I have reviewed the triage vital signs and the nursing notes.   HISTORY  Chief Complaint Chest Pain    HPI [facility] is a 31 y.o. male who comes into the hospital today with chest pain.  The patient states that he woke up around 1 AM with chest pain.  The pain is in the left side of his chest.  He had some nausea and he reports he has some slight shortness of breath lightheadedness and dizziness.  The patient reports that his pain is currently a 4-5 out of 10 in intensity.  He states that his left arm feels a little bit numb.  He has had this chest pain multiple times and saw Dr. Juliann Paresallwood recently.  The patient states that he had a stress test and an echocardiogram was told that everything was fine.  The patient states that he did not take anything for pain at home. The patient states that he did have some recently diagnosed abnormal thyroid levels.  He is here for evaluation.   History reviewed. No pertinent past medical history.  There are no active problems to display for this patient.   History reviewed. No pertinent surgical history.  Prior to Admission medications   Medication Sig Start Date End Date Taking? Authorizing Provider  levothyroxine (SYNTHROID) 112 MCG tablet Take 1 tablet (112 mcg total) daily by mouth. 09/21/17 10/21/17  Orvil FeilWoods, Jaclyn M, PA-C  meclizine (ANTIVERT) 25 MG tablet Take 1 tablet (25 mg total) by mouth 3 (three) times daily as needed for dizziness. 02/06/17   Jene EveryKinner, Robert, MD  sucralfate (CARAFATE) 1 g tablet Take 1 tablet (1 g total) by mouth 2 (two) times daily. 10/04/17   Rebecka ApleyWebster, Allison P, MD    Allergies Patient has no known allergies.  History reviewed. No pertinent family history.  Social History Social History   Tobacco Use  . Smoking  status: Current Every Day Smoker    Packs/day: 1.00    Types: Cigarettes  . Smokeless tobacco: Current User  . Tobacco comment: states stopped 3 weeks ago  Substance Use Topics  . Alcohol use: No  . Drug use: Not on file    Review of Systems  Constitutional: No fever/chills Eyes: No visual changes. ENT: No sore throat. Cardiovascular:  chest pain. Respiratory: Denies shortness of breath. Gastrointestinal:  Nausea with no abdominal pain, no vomiting.  No diarrhea.  No constipation. Genitourinary: Negative for dysuria. Musculoskeletal: Negative for back pain. Skin: Negative for rash. Neurological: Negative for headaches, focal weakness or numbness.   ____________________________________________   PHYSICAL EXAM:  VITAL SIGNS: ED Triage Vitals  Enc Vitals Group     BP 10/04/17 0302 139/83     Pulse Rate 10/04/17 0302 65     Resp 10/04/17 0302 17     Temp 10/04/17 0301 98.1 F (36.7 C)     Temp Source 10/04/17 0301 Oral     SpO2 10/04/17 0302 100 %     Weight 10/04/17 0301 170 lb (77.1 kg)     Height --      Head Circumference --      Peak Flow --      Pain Score 10/04/17 0700 0     Pain Loc --      Pain Edu? --      Excl. in GC? --  Constitutional: Alert and oriented. Well appearing and in mild distress. Eyes: Conjunctivae are normal. PERRL. EOMI. Head: Atraumatic. Nose: No congestion/rhinnorhea. Mouth/Throat: Mucous membranes are moist.  Oropharynx non-erythematous. Cardiovascular: Normal rate, regular rhythm. Grossly normal heart sounds.  Good peripheral circulation. Respiratory: Normal respiratory effort.  No retractions. Lungs CTAB. Gastrointestinal: Soft and nontender. No distention. Positive bowel sounds Musculoskeletal: No lower extremity tenderness nor edema.   Neurologic:  Normal speech and language. Skin:  Skin is warm, dry and intact.  Psychiatric: Mood and affect are normal.   ____________________________________________   LABS (all labs  ordered are listed, but only abnormal results are displayed)  Labs Reviewed  BASIC METABOLIC PANEL - Abnormal; Notable for the following components:      Result Value   Creatinine, Ser 1.26 (*)    All other components within normal limits  CBC  TROPONIN I  TROPONIN I   ____________________________________________  EKG  ED ECG REPORT I, Rebecka ApleyWebster,  Allison P, the attending physician, personally viewed and interpreted this ECG.   Date: 10/04/2017  EKG Time: 259  Rate: 63  Rhythm: normal sinus rhythm  Axis: normal  Intervals:none  ST&T Change: none  ____________________________________________  RADIOLOGY  Dg Chest 2 View  Result Date: 10/04/2017 CLINICAL DATA:  LEFT-sided chest pain and chills for 1 hour. Started on thyroid medication November 15th. EXAM: CHEST  2 VIEW COMPARISON:  Chest radiograph September 21, 2017 FINDINGS: Cardiomediastinal silhouette is normal. No pleural effusions or focal consolidations. Trachea projects midline and there is no pneumothorax. Soft tissue planes and included osseous structures are non-suspicious. Mild asymmetrically prominent LEFT breast tissue. IMPRESSION: No active cardiopulmonary disease. Electronically Signed   By: Awilda Metroourtnay  Bloomer M.D.   On: 10/04/2017 03:16    ____________________________________________   PROCEDURES  Procedure(s) performed: None  Procedures  Critical Care performed: No  ____________________________________________   INITIAL IMPRESSION / ASSESSMENT AND PLAN / ED COURSE  As part of my medical decision making, I reviewed the following data within the electronic MEDICAL RECORD NUMBER Notes from prior ED visits and [place] Controlled Substance Database  This is a 31 year old male who comes into the hospital today with chest pain.  The patient states that it started around 1 AM.  He has had this pain before.  My differential diagnosis includes acute coronary syndrome and gastritis.  I did check some blood work and  give the patient a GI cocktail as well as some Toradol.  He had a repeat troponin after the initial blood work and it was negative.  The patient's troponin is negative.  I feel that he may have some gastrointestinal symptoms causing some of his pain since it is located primarily on the left with no significant radiation or other signs aside from nausea.  The patient will be discharged home and encouraged to follow-up with his primary care physician as well as GI.  He has no further questions or concerns.      ____________________________________________   FINAL CLINICAL IMPRESSION(S) / ED DIAGNOSES  Final diagnoses:  Chest pain, unspecified type     ED Discharge Orders        Ordered    sucralfate (CARAFATE) 1 g tablet  2 times daily     10/04/17 0740       Note:  This document was prepared using Dragon voice recognition software and may include unintentional dictation errors.    Rebecka ApleyWebster, Allison P, MD 10/04/17 [phone]

## 2017-10-04 NOTE — ED Notes (Signed)
NAD noted at time of D/C. Pt denies questions or concerns. Pt ambulatory to the lobby at this time.  

## 2017-10-04 NOTE — ED Notes (Signed)
Unable to obtain E-sig, E-sig pad not working.  

## 2017-10-04 NOTE — Discharge Instructions (Signed)
Please follow up with your primary care physician and GI for further evaluation of this chest pain

## 2017-10-05 ENCOUNTER — Emergency Department
Admission: EM | Admit: 2017-10-05 | Discharge: 2017-10-06 | Disposition: A | Discharge status: Home / Self Care | Payer: Self-pay | Attending: Emergency Medicine | Admitting: Emergency Medicine

## 2017-10-05 ENCOUNTER — Emergency Department: Payer: Self-pay

## 2017-10-05 DIAGNOSIS — F419 Anxiety disorder, unspecified: Secondary | ICD-10-CM | POA: Insufficient documentation

## 2017-10-05 DIAGNOSIS — K219 Gastro-esophageal reflux disease without esophagitis: Secondary | ICD-10-CM | POA: Insufficient documentation

## 2017-10-05 DIAGNOSIS — F17210 Nicotine dependence, cigarettes, uncomplicated: Secondary | ICD-10-CM | POA: Insufficient documentation

## 2017-10-05 DIAGNOSIS — F17220 Nicotine dependence, chewing tobacco, uncomplicated: Secondary | ICD-10-CM | POA: Insufficient documentation

## 2017-10-05 DIAGNOSIS — E039 Hypothyroidism, unspecified: Secondary | ICD-10-CM | POA: Insufficient documentation

## 2017-10-05 HISTORY — DX: Hypothyroidism, unspecified: E03.9

## 2017-10-05 LAB — TROPONIN I: Troponin I: <0.03 ng/mL (ref <0.03)

## 2017-10-05 LAB — CBC
HCT: 46.2 % (ref 40.0–52.0)
Hemoglobin: 15.1 g/dL (ref 13.0–18.0)
MCH: 29.7 pg (ref 26.0–34.0)
MCHC: 32.7 g/dL (ref 32.0–36.0)
MCV: 90.9 fL (ref 80.0–100.0)
PLATELETS: 276 10*3/uL (ref 150–440)
RBC: 5.09 MIL/uL (ref 4.40–5.90)
RDW: 13.8 % (ref 11.5–14.5)
WBC: 6.8 10*3/uL (ref 3.8–10.6)

## 2017-10-05 LAB — BASIC METABOLIC PANEL
Anion gap: 7 (ref 5–15)
BUN: 22 mg/dL — AB (ref 6–20)
CALCIUM: 9.3 mg/dL (ref 8.9–10.3)
CO2: 28 mmol/L (ref 22–32)
Chloride: 103 mmol/L (ref 101–111)
Creatinine, Ser: 1.28 mg/dL — ABNORMAL HIGH (ref 0.61–1.24)
GFR calc Af Amer: >60 mL/min (ref >60)
GLUCOSE: 95 mg/dL (ref 65–99)
Potassium: 4.1 mmol/L (ref 3.5–5.1)
Sodium: 138 mmol/L (ref 135–145)

## 2017-10-05 LAB — TSH: TSH: 0.145 u[IU]/mL — ABNORMAL LOW (ref 0.350–4.500)

## 2017-10-05 MED ORDER — GI COCKTAIL ~~LOC~~
30.0000 mL | Freq: Once | ORAL | Status: AC
  Administered 2017-10-05: 30 mL via ORAL
  Filled 2017-10-05: qty 30

## 2017-10-05 NOTE — ED Notes (Signed)
Dr Brown and Butch, RN at bedside at this time. 

## 2017-10-05 NOTE — ED Notes (Signed)
Chart reviewed; protocols added

## 2017-10-05 NOTE — ED Provider Notes (Signed)
[facility] Emergency Department Provider Note    First MD Initiated Contact with Patient 10/05/17 2304     (approximate)  I have reviewed the triage vital signs and the nursing notes.   HISTORY  Chief Complaint Chest Pain    HPI April HoldingJoseph J Allred is a 31 y.o. male with history of recently diagnosed Hypothyroidism presents with generalized body shaking tonight followed by burning epigastric abdominal pain with "bubbling" sensation in the left chest.  Patient states that he has had multiple episodes of the same for which she seen Dr. Elijah Birkaldwell had a stress test and echocardiogram performed which was negative.   Past Medical History:  Diagnosis Date  . Hypothyroid     There are no active problems to display for this patient.   History reviewed. No pertinent surgical history.  Prior to Admission medications   Medication Sig Start Date End Date Taking? Authorizing Provider  levothyroxine (SYNTHROID) 112 MCG tablet Take 1 tablet (112 mcg total) daily by mouth. 09/21/17 10/21/17  Orvil FeilWoods, Jaclyn M, PA-C  meclizine (ANTIVERT) 25 MG tablet Take 1 tablet (25 mg total) by mouth 3 (three) times daily as needed for dizziness. 02/06/17   Jene EveryKinner, Robert, MD  sucralfate (CARAFATE) 1 g tablet Take 1 tablet (1 g total) by mouth 2 (two) times daily. 10/04/17   Rebecka ApleyWebster, Allison P, MD    Allergies No known drug allergies No family history on file.  Social History Social History   Tobacco Use  . Smoking status: Current Every Day Smoker    Packs/day: 1.00    Types: Cigarettes  . Smokeless tobacco: Current User  . Tobacco comment: states stopped 3 weeks ago  Substance Use Topics  . Alcohol use: No  . Drug use: Not on file    Review of Systems Constitutional: No fever/chills Eyes: No visual changes. ENT: No sore throat. Cardiovascular: Denies chest pain. Respiratory: Denies shortness of breath. Gastrointestinal: Positive for burning epigastric pain.  No nausea,  no vomiting.  No diarrhea.  No constipation. Genitourinary: Negative for dysuria. Musculoskeletal: Negative for neck pain.  Negative for back pain. Integumentary: Negative for rash. Neurological: Negative for headaches, focal weakness or numbness.  ____________________________________________   PHYSICAL EXAM:  VITAL SIGNS: ED Triage Vitals  Enc Vitals Group     BP 10/05/17 2010 134/80     Pulse Rate 10/05/17 2010 65     Resp 10/05/17 2010 16     Temp 10/05/17 2010 98.4 F (36.9 C)     Temp Source 10/05/17 2010 Oral     SpO2 10/05/17 2010 100 %     Weight 10/05/17 2008 77.1 kg (170 lb)     Height --      Head Circumference --      Peak Flow --      Pain Score 10/05/17 2008 6     Pain Loc --      Pain Edu? --      Excl. in GC? --     Constitutional: Alert and oriented. Well appearing and in no acute distress. Eyes: Conjunctivae are normal. PERRL. EOMI. Head: Atraumatic. Mouth/Throat: Mucous membranes are moist.  Oropharynx non-erythematous. Neck: No stridor.  No cervical spine tenderness to palpation. Cardiovascular: Normal rate, regular rhythm. Good peripheral circulation. Grossly normal heart sounds. Respiratory: Normal respiratory effort.  No retractions. Lungs CTAB. Gastrointestinal: Soft and nontender. No distention.  Musculoskeletal: No lower extremity tenderness nor edema. No gross deformities of extremities. Neurologic:  Normal speech and language. No  gross focal neurologic deficits are appreciated.  Skin:  Skin is warm, dry and intact. No rash noted. Psychiatric: Mood and affect are normal. Speech and behavior are normal.  ____________________________________________   LABS (all labs ordered are listed, but only abnormal results are displayed)  Labs Reviewed  BASIC METABOLIC PANEL - Abnormal; Notable for the following components:      Result Value   BUN 22 (*)    Creatinine, Ser 1.28 (*)    All other components within normal limits  CBC  TROPONIN I  TSH    ____________________________________________  EKG  ED ECG REPORT I, [place] N Arta Stump, the attending physician, personally viewed and interpreted this ECG.   Date: 10/05/2017  EKG Time: 8:03 PM  Rate: 59  Rhythm: Normal sinus rhythm  Axis: Normal  Intervals: Normal  ST&T Change: None  ____________________________________________  RADIOLOGY I, [place] N Eurydice Calixto, personally viewed and evaluated these images (plain radiographs) as part of my medical decision making, as well as reviewing the written report by the radiologist.  Dg Chest 2 View  Result Date: 10/05/2017 CLINICAL DATA:  Intermittent LEFT chest pain, dizziness/ lightheadedness for months. EXAM: CHEST  2 VIEW COMPARISON:  Chest radiograph October 04, 2017 FINDINGS: Cardiomediastinal silhouette is normal. No pleural effusions or focal consolidations. Trachea projects midline and there is no pneumothorax. Soft tissue planes and included osseous structures are non-suspicious. IMPRESSION: Stable normal chest. Electronically Signed   By: Awilda Metroourtnay  Bloomer M.D.   On: 10/05/2017 22:07      Procedures   ____________________________________________   INITIAL IMPRESSION / ASSESSMENT AND PLAN / ED COURSE  As part of my medical decision making, I reviewed the following data within the electronic MEDICAL RECORD [id number] year old male presented with above-stated history and physical exam of generalized body tremulousness epigastric burning discomfort.  Patient given a GI cocktail with improvement of pain.  Suspect GERD as etiology for the patient's epigastric burning discomfort.  ____________________________________________  FINAL CLINICAL IMPRESSION(S) / ED DIAGNOSES  Final diagnoses:  Gastroesophageal reflux disease, esophagitis presence not specified  Anxiety     MEDICATIONS GIVEN DURING THIS VISIT:  Medications  gi cocktail (Maalox,Lidocaine,Donnatal) (not administered)     ED Discharge Orders    None       Note:   This document was prepared using Dragon voice recognition software and may include unintentional dictation errors.    Darci CurrentBrown, [place] N, MD 10/06/17 [phone]

## 2017-10-05 NOTE — ED Triage Notes (Signed)
Patient c/o left chest pain, dizziness/lightheadedness. Patient reports hx of the same.

## 2017-10-05 NOTE — ED Notes (Signed)
Pt seen multiple times for chest pain. Recently saw a cardiologist and states he said it "wasn't my heart". Pt states they found at that time his thyroid was high and he was started on thyroid medication. Pt states this has been going on for a year, and does not have a family doctor. Advised pt he needs a pcp to follow up with his thyroid and the frequent testing they do to adjust the medication. Pt states [facility] told him he would have to wait a few weeks for a dr since he was a new pt. Advised that is normal and still needs to make an appt and get a pcp.

## 2017-10-31 ENCOUNTER — Emergency Department
Admission: EM | Admit: 2017-10-31 | Discharge: 2017-10-31 | Disposition: A | Discharge status: Home / Self Care | Payer: Self-pay | Attending: Emergency Medicine | Admitting: Emergency Medicine

## 2017-10-31 DIAGNOSIS — M62838 Other muscle spasm: Secondary | ICD-10-CM | POA: Insufficient documentation

## 2017-10-31 DIAGNOSIS — Z87891 Personal history of nicotine dependence: Secondary | ICD-10-CM | POA: Insufficient documentation

## 2017-10-31 DIAGNOSIS — E039 Hypothyroidism, unspecified: Secondary | ICD-10-CM | POA: Insufficient documentation

## 2017-10-31 DIAGNOSIS — Z79899 Other long term (current) drug therapy: Secondary | ICD-10-CM | POA: Insufficient documentation

## 2017-10-31 MED ORDER — LEVOTHYROXINE SODIUM 125 MCG PO TABS
125.0000 ug | ORAL_TABLET | Freq: Every day | ORAL | 1 refills | Status: DC
Start: 2017-10-31 — End: 2017-12-13

## 2017-10-31 NOTE — ED Triage Notes (Signed)
Pt presents via POV c/o "jumping" on upper left abd since saturaday intermittently per pt report. Pt denies currently. Pt denies pain currently, reports pain only with "jumping". Asked pt if muscles spasms were occurring and pt states "I dont know, its just jumping and I can see it and it hurts when it happens".

## 2017-10-31 NOTE — ED Provider Notes (Signed)
[facility] Emergency Department Provider Note ____________________________________________  Time seen: 1739  I have reviewed the triage vital signs and the nursing notes.  HISTORY  Chief Complaint  Abdominal Pain  HPI Austin Fletcher is a 31 y.o. male presents to the ED for evaluation of "jumping" to his left upper abdominal wall since Saturday.  Patient describes intermittent movement, "like a pulse," to his abdominal wall.  He denies any injury, trauma, or accident.  Also denies any nausea, vomiting, abdominal pain, or bowel changes.  Patient describes the onset without cause and it may occur whether he is lying flat or sitting up.  He denies any referral of the discomfort but notes that his never happened in the past.  He is currently being evaluated for hypothyroidism.  Started on a dose of L-thyroxine seen back in November.  He has since had multiple ED visits for various complaints since that diagnosis.  He is also had a complete cardiac workup with a nuclear medicine study which were reported as normal.  He is also being managed for dizziness which may be a symptom of his hypothyroidism.  He denies any recent injuries, fevers, chills, or shortness of breath.  Past Medical History:  Diagnosis Date  . Hypothyroid     There are no active problems to display for this patient.  History reviewed. No pertinent surgical history.  Prior to Admission medications   Medication Sig Start Date End Date Taking? Authorizing Provider  levothyroxine (SYNTHROID, LEVOTHROID) 125 MCG tablet Take 1 tablet (125 mcg total) by mouth daily before breakfast. 10/31/17 12/30/17  Takara Sermons, Charlesetta IvoryJenise V Bacon, PA-C  meclizine (ANTIVERT) 25 MG tablet Take 1 tablet (25 mg total) by mouth 3 (three) times daily as needed for dizziness. 02/06/17   Jene EveryKinner, Robert, MD  sucralfate (CARAFATE) 1 g tablet Take 1 tablet (1 g total) by mouth 2 (two) times daily. 10/04/17   Rebecka ApleyWebster, Allison P, MD     Allergies Patient has no known allergies.  History reviewed. No pertinent family history.  Social History Social History   Tobacco Use  . Smoking status: Former Smoker    Packs/day: 1.00    Types: Cigarettes  . Smokeless tobacco: Never Used  . Tobacco comment: states stopped 3 weeks ago  Substance Use Topics  . Alcohol use: No  . Drug use: Not on file    Review of Systems  Constitutional: Negative for fever. Cardiovascular: Negative for chest pain. Respiratory: Negative for shortness of breath. Gastrointestinal: Negative for abdominal pain, vomiting and diarrhea. Genitourinary: Negative for dysuria. Musculoskeletal: Negative for back pain. Muscle spasms of abdominal wall.  Skin: Negative for rash. Neurological: Negative for headaches, focal weakness or numbness. ____________________________________________  PHYSICAL EXAM:  VITAL SIGNS: ED Triage Vitals  Enc Vitals Group     BP 10/31/17 1648 (!) 134/98     Pulse Rate 10/31/17 1648 72     Resp 10/31/17 1648 14     Temp 10/31/17 1648 98.1 F (36.7 C)     Temp Source 10/31/17 1648 Oral     SpO2 10/31/17 1648 99 %     Weight 10/31/17 1648 170 lb (77.1 kg)     Height 10/31/17 1648 6' (1.829 m)     Head Circumference --      Peak Flow --      Pain Score 10/31/17 1722 0     Pain Loc --      Pain Edu? --      Excl. in GC? --  Constitutional: Alert and oriented. Well appearing and in no distress. Head: Normocephalic and atraumatic. Eyes: Conjunctivae are normal. Normal extraocular movements Cardiovascular: Normal rate, regular rhythm. Normal distal pulses. Respiratory: Normal respiratory effort. No wheezes/rales/rhonchi. Gastrointestinal: Soft and nontender. No distention, thyromegaly, rebound, guarding, or rigidity.  Normoactive bowel sounds x4.  No muscle spasm, abdominal wall defect, or hernia appreciated over the left upper quadrant which is the area of concern. Musculoskeletal: Nontender with normal  range of motion in all extremities.  Neurologic:  Normal gait without ataxia. Normal speech and language. No gross focal neurologic deficits are appreciated. Skin:  Skin is warm, dry and intact. No rash noted. Psychiatric: Mood and affect are normal. Patient exhibits appropriate insight and judgment. ____________________________________________  INITIAL IMPRESSION / ASSESSMENT AND PLAN / ED COURSE  Patient with ED evaluation of intermittent abdominal wall muscle spasm.  Patient's symptoms given his recent history and recent repeat labs about a week earlier indicate he still not well controlled in regards to his hypothyroidism.  We discussed the possibility of his symptoms being related to his subtherapeutic levothyroxine dose.  We decided to increase his dose to 125 mcg daily.  [facility].  Return precautions have otherwise been reviewed.  Patient verbalizes understanding and is reassured by his exam and chart review. ____________________________________________  FINAL CLINICAL IMPRESSION(S) / ED DIAGNOSES  Final diagnoses:  Hypothyroidism, unspecified type  Muscle spasm      Karmen StabsMenshew, Charlesetta IvoryJenise V Bacon, PA-C 10/31/17 1906    Jene EveryKinner, Robert, MD 10/31/17 [phone]

## 2017-10-31 NOTE — Discharge Instructions (Signed)
Your exam is normal at this time. Your symptoms appear to be related to your poorly controlled hypothyroid disease. Take the prescription medicine as directed. [facility] an [facility] for management as discussed. Return to the ED as needed.

## 2017-10-31 NOTE — ED Notes (Signed)
Pt presents with his abdomen "jumping" and causing pain into his chest. The pain starts in LUQ of abdomen and does up to mid sternum. Pt states pain is intermittent and dull. Pt states pains last around 15 seconds. NAD noted.

## 2017-12-07 ENCOUNTER — Emergency Department
Admission: EM | Admit: 2017-12-07 | Discharge: 2017-12-07 | Disposition: A | Discharge status: Home / Self Care | Payer: BLUE CROSS/BLUE SHIELD | Attending: Emergency Medicine | Admitting: Emergency Medicine

## 2017-12-07 ENCOUNTER — Emergency Department: Payer: BLUE CROSS/BLUE SHIELD

## 2017-12-07 ENCOUNTER — Encounter: Payer: Self-pay | Admitting: Emergency Medicine

## 2017-12-07 DIAGNOSIS — Z87891 Personal history of nicotine dependence: Secondary | ICD-10-CM | POA: Insufficient documentation

## 2017-12-07 DIAGNOSIS — Z79899 Other long term (current) drug therapy: Secondary | ICD-10-CM | POA: Diagnosis not present

## 2017-12-07 DIAGNOSIS — E039 Hypothyroidism, unspecified: Secondary | ICD-10-CM | POA: Insufficient documentation

## 2017-12-07 DIAGNOSIS — R079 Chest pain, unspecified: Secondary | ICD-10-CM

## 2017-12-07 LAB — TROPONIN I: Troponin I: <0.03 ng/mL (ref <0.03)

## 2017-12-07 LAB — CBC
HEMATOCRIT: 40.9 % (ref 40.0–52.0)
Hemoglobin: 13.8 g/dL (ref 13.0–18.0)
MCH: 30.1 pg (ref 26.0–34.0)
MCHC: 33.7 g/dL (ref 32.0–36.0)
MCV: 89.3 fL (ref 80.0–100.0)
Platelets: 266 10*3/uL (ref 150–440)
RBC: 4.58 MIL/uL (ref 4.40–5.90)
RDW: 12.9 % (ref 11.5–14.5)
WBC: 5.4 10*3/uL (ref 3.8–10.6)

## 2017-12-07 LAB — BASIC METABOLIC PANEL
ANION GAP: 7 (ref 5–15)
BUN: 15 mg/dL (ref 6–20)
CO2: 25 mmol/L (ref 22–32)
Calcium: 9 mg/dL (ref 8.9–10.3)
Chloride: 108 mmol/L (ref 101–111)
Creatinine, Ser: 1.03 mg/dL (ref 0.61–1.24)
GFR calc Af Amer: >60 mL/min (ref >60)
GLUCOSE: 89 mg/dL (ref 65–99)
POTASSIUM: 3.8 mmol/L (ref 3.5–5.1)
Sodium: 140 mmol/L (ref 135–145)

## 2017-12-07 MED ORDER — HYDROXYZINE HCL 25 MG PO TABS: 25.0000 mg | ORAL_TABLET | Freq: Two times a day (BID) | ORAL | 0 refills | Status: DC | PRN

## 2017-12-07 NOTE — ED Provider Notes (Signed)
[facility] Emergency Department Provider Note  Time seen: 7:33 AM  I have reviewed the triage vital signs and the nursing notes.   HISTORY  Chief Complaint Chest Pain    HPI Austin Fletcher is a 32 y.o. male with a past medical history of hypothyroidism on Synthroid who presents to the emergency department for chest discomfort.  According to the patient for the past for 5 months he has been experiencing intermittent chest pain she states is more of a sharp pain sometimes a pressure sensation to the center of his chest radiating to his left shoulder.  Patient felt the same symptoms this morning along with proximally 1 minute of shortness of breath.  Patient states the pain and shortness of breath have since resolved, no complaints at this time.  Patient states he has been seen multiple times for this over the past several months including by cardiology.  Was just seen 2 days ago at [facility] emergency department for the same and was prescribed Zithromax for possible lung infection.  Patient denies any fever, cough, congestion.  Denies any nausea vomiting.  No leg pain or swelling.  Does state a family history of MI as well as CVA.  Past Medical History:  Diagnosis Date  . Hypothyroid     There are no active problems to display for this patient.   History reviewed. No pertinent surgical history.  Prior to Admission medications   Medication Sig Start Date End Date Taking? Authorizing Provider  levothyroxine (SYNTHROID, LEVOTHROID) 125 MCG tablet Take 1 tablet (125 mcg total) by mouth daily before breakfast. 10/31/17 12/30/17  Menshew, Charlesetta Ivory, PA-C  meclizine (ANTIVERT) 25 MG tablet Take 1 tablet (25 mg total) by mouth 3 (three) times daily as needed for dizziness. 02/06/17   Jene Every, MD  sucralfate (CARAFATE) 1 g tablet Take 1 tablet (1 g total) by mouth 2 (two) times daily. 10/04/17   Rebecka Apley, MD    No Known Allergies  No family history on  file.  Social History Social History   Tobacco Use  . Smoking status: Former Smoker    Packs/day: 1.00    Types: Cigarettes  . Smokeless tobacco: Never Used  . Tobacco comment: states stopped 3 weeks ago  Substance Use Topics  . Alcohol use: No  . Drug use: No    Review of Systems Constitutional: Negative for fever. Eyes: Negative for visual complaints ENT: Negative for recent illness/congestion Cardiovascular: Central left-sided chest pain radiating to left shoulder at times.  Gone currently Respiratory: 1 minute of shortness of breath today, none currently Gastrointestinal: Negative for abdominal pain, vomiting Genitourinary: Negative for urinary compaints Musculoskeletal: Negative for leg pain or swelling Skin: Negative for skin complaints  Neurological: Negative for headache All other ROS negative  ____________________________________________   PHYSICAL EXAM:  VITAL SIGNS: ED Triage Vitals [12/07/17 0731]  Enc Vitals Group     BP      Pulse      Resp      Temp 97.9 F (36.6 C)     Temp Source Oral     SpO2      Weight 175 lb (79.4 kg)     Height 6\' 1"  (1.854 m)     Head Circumference      Peak Flow      Pain Score      Pain Loc      Pain Edu?      Excl. in GC?  Constitutional: Alert and oriented. Well appearing and in no distress. Eyes: Normal exam ENT   Head: Normocephalic and atraumatic   Mouth/Throat: Mucous membranes are moist. Cardiovascular: Normal rate, regular rhythm. No murmur Respiratory: Normal respiratory effort without tachypnea nor retractions. Breath sounds are clear Gastrointestinal: Soft and nontender. No distention.  Musculoskeletal: Nontender with normal range of motion in all extremities. No lower extremity tenderness or edema.  Neurologic:  Normal speech and language. No gross focal neurologic deficits  Skin:  Skin is warm, dry and intact.  Psychiatric: Mood and affect are normal.    ____________________________________________    EKG  EKG reviewed and interpreted by myself shows sinus arrhythmia at 65 bpm with a narrow QRS, normal axis, normal intervals, no ST changes.  Overall normal EKG.  ____________________________________________    RADIOLOGY  Chest x-ray normal  ____________________________________________   INITIAL IMPRESSION / ASSESSMENT AND PLAN / ED COURSE  Pertinent labs & imaging results that were available during my care of the patient were reviewed by me and considered in my medical decision making (see chart for details).  Patient presents to the emergency department for left-sided chest pain, now resolved.  Differential would include ACS, PE, pneumonia, pneumothorax, anxiety, chest wall pain.  Overall the patient appears very well, no distress, no complaints at this time.  I reviewed the patient's records she was [facility] for similar complaints.  Was ultimately discharged with Zithromax for possible infection.  Had a negative workup including negative heart enzymes, negative d-dimer.  In reviewing the patient's records further he has been seen multiple times with normal workups in the past.  In October he was referred to cardiology had a stress test and echocardiogram performed with overall normal results.  Low risk stress test.  Given the patient's significant workup in the past with negative findings it would raise the possibility that this could be related to anxiety.  Patient has had elevated thyroid levels, currently taking Synthroid, could possibly be related to elevated thyroid which could induce anxiety as well.  Could also be underlying chest wall discomfort or tietze syndrome.  We will obtain an EKG and labs including cardiac enzymes.  Given the patient's recent possible lung infection we will repeat a chest x-ray today to ensure no pneumonia.  Overall the patient appears very well.  We will refer the patient  back to his primary care doctor regarding his slightly elevated [facility] 2 days ago.  Patient's labs are within normal limits.  Troponin is negative.  Chest x-ray is normal.  EKG is normal. ____________________________________________   FINAL CLINICAL IMPRESSION(S) / ED DIAGNOSES  Chest pain    Minna AntisPaduchowski, Carys Malina, MD 12/07/17 [phone]

## 2017-12-07 NOTE — ED Notes (Signed)
Pt alert and oriented X4, active, cooperative, pt in NAD. RR even and unlabored, color WNL.  Pt informed to return if any life threatening symptoms occur.  Discharge and followup instructions reviewed.  

## 2017-12-07 NOTE — ED Notes (Signed)
Patient transported to X-ray 

## 2017-12-07 NOTE — Discharge Instructions (Signed)
You have been seen in the emergency department today for chest pain. Your workup has shown normal results. As we discussed please follow-up with your primary care physician in the next 1-2 days for recheck. Return to the emergency department for any further chest pain, trouble breathing, or any other symptom personally concerning to yourself. °

## 2017-12-07 NOTE — ED Triage Notes (Signed)
Pt in via [facility] with complaints of sudden onset chest pain w/ associated shortness of breath while driving to work; pt reports similar symptoms x a few months being seen and evaluated per cardiology, exam unremarkable.  Pt A/Ox4, NAD noted at this time.  EDP at bedside.

## 2017-12-20 ENCOUNTER — Encounter
Admission: RE | Disposition: A | Discharge status: Home / Self Care | Payer: Self-pay | Source: Ambulatory Visit | Attending: Internal Medicine

## 2017-12-20 ENCOUNTER — Ambulatory Visit
Admission: RE | Admit: 2017-12-20 | Discharge: 2017-12-20 | Disposition: A | Discharge status: Home / Self Care | Payer: BLUE CROSS/BLUE SHIELD | Source: Ambulatory Visit | Attending: Internal Medicine | Admitting: Internal Medicine

## 2017-12-20 ENCOUNTER — Encounter: Payer: Self-pay | Admitting: *Deleted

## 2017-12-20 DIAGNOSIS — Z87891 Personal history of nicotine dependence: Secondary | ICD-10-CM | POA: Insufficient documentation

## 2017-12-20 DIAGNOSIS — R002 Palpitations: Secondary | ICD-10-CM | POA: Diagnosis not present

## 2017-12-20 DIAGNOSIS — Z8249 Family history of ischemic heart disease and other diseases of the circulatory system: Secondary | ICD-10-CM | POA: Insufficient documentation

## 2017-12-20 DIAGNOSIS — Z7982 Long term (current) use of aspirin: Secondary | ICD-10-CM | POA: Insufficient documentation

## 2017-12-20 DIAGNOSIS — I200 Unstable angina: Secondary | ICD-10-CM

## 2017-12-20 HISTORY — PX: LEFT HEART CATH AND CORONARY ANGIOGRAPHY: CATH118249

## 2017-12-20 SURGERY — LEFT HEART CATH AND CORONARY ANGIOGRAPHY
Anesthesia: Moderate Sedation | Laterality: Left

## 2017-12-20 SURGERY — LEFT HEART CATH AND CORONARY ANGIOGRAPHY
Anesthesia: Moderate Sedation

## 2017-12-20 MED ORDER — ONDANSETRON HCL 4 MG/2ML IJ SOLN: 4.0000 mg | Freq: Four times a day (QID) | INTRAMUSCULAR | Status: DC | PRN

## 2017-12-20 MED ORDER — SODIUM CHLORIDE 0.9 % WEIGHT BASED INFUSION
3.0000 mL/kg/h | INTRAVENOUS | Status: AC
  Administered 2017-12-20: 3 mL/kg/h via INTRAVENOUS

## 2017-12-20 MED ORDER — SODIUM CHLORIDE 0.9 % IV SOLN: 250.0000 mL | INTRAVENOUS | Status: DC | PRN

## 2017-12-20 MED ORDER — HEPARIN SODIUM (PORCINE) 1000 UNIT/ML IJ SOLN
INTRAMUSCULAR | Status: AC
  Filled 2017-12-20: qty 1

## 2017-12-20 MED ORDER — MIDAZOLAM HCL 2 MG/2ML IJ SOLN
INTRAMUSCULAR | Status: AC
  Filled 2017-12-20: qty 2

## 2017-12-20 MED ORDER — SODIUM CHLORIDE 0.9 % WEIGHT BASED INFUSION: 1.0000 mL/kg/h | INTRAVENOUS | Status: DC

## 2017-12-20 MED ORDER — IOPAMIDOL (ISOVUE-300) INJECTION 61%
INTRAVENOUS | Status: DC | PRN
  Administered 2017-12-20: 80 mL via INTRA_ARTERIAL

## 2017-12-20 MED ORDER — FENTANYL CITRATE (PF) 100 MCG/2ML IJ SOLN
INTRAMUSCULAR | Status: AC
  Filled 2017-12-20: qty 2

## 2017-12-20 MED ORDER — SODIUM CHLORIDE 0.9% FLUSH: 3.0000 mL | Freq: Two times a day (BID) | INTRAVENOUS | Status: DC

## 2017-12-20 MED ORDER — SODIUM CHLORIDE 0.9% FLUSH: 3.0000 mL | INTRAVENOUS | Status: DC | PRN

## 2017-12-20 MED ORDER — MIDAZOLAM HCL 2 MG/2ML IJ SOLN
INTRAMUSCULAR | Status: DC | PRN
  Administered 2017-12-20: 1 mg via INTRAVENOUS

## 2017-12-20 MED ORDER — FENTANYL CITRATE (PF) 100 MCG/2ML IJ SOLN
INTRAMUSCULAR | Status: DC | PRN
  Administered 2017-12-20: 25 ug via INTRAVENOUS

## 2017-12-20 MED ORDER — VERAPAMIL HCL 2.5 MG/ML IV SOLN
INTRAVENOUS | Status: AC
  Filled 2017-12-20: qty 2

## 2017-12-20 MED ORDER — VERAPAMIL HCL 2.5 MG/ML IV SOLN
INTRAVENOUS | Status: DC | PRN
  Administered 2017-12-20: 2.5 mg via INTRA_ARTERIAL

## 2017-12-20 MED ORDER — ASPIRIN 81 MG PO CHEW: 81.0000 mg | CHEWABLE_TABLET | ORAL | Status: DC

## 2017-12-20 MED ORDER — HEPARIN (PORCINE) IN NACL 2-0.9 UNIT/ML-% IJ SOLN
INTRAMUSCULAR | Status: AC
  Filled 2017-12-20: qty 500

## 2017-12-20 MED ORDER — HEPARIN SODIUM (PORCINE) 1000 UNIT/ML IJ SOLN
INTRAMUSCULAR | Status: DC | PRN
  Administered 2017-12-20: 4000 [IU] via INTRAVENOUS

## 2017-12-20 MED ORDER — ACETAMINOPHEN 325 MG PO TABS: 650.0000 mg | ORAL_TABLET | ORAL | Status: DC | PRN

## 2017-12-20 SURGICAL SUPPLY — 13 items
CATH 5F 110X4 TIG (CATHETERS) ×2 IMPLANT
CATH 5FR JL4 DIAGNOSTIC (CATHETERS) IMPLANT
CATH INFINITI 5FR ANG PIGTAIL (CATHETERS) ×2 IMPLANT
CATH INFINITI JR4 5F (CATHETERS) IMPLANT
DEVICE RAD TR BAND REGULAR (VASCULAR PRODUCTS) ×2 IMPLANT
GLIDESHEATH SLEND A-KIT 6F 22G (SHEATH) ×2 IMPLANT
KIT MANI 3VAL PERCEP (MISCELLANEOUS) ×3 IMPLANT
NDL PERC 18GX7CM (NEEDLE) IMPLANT
NEEDLE PERC 18GX7CM (NEEDLE) IMPLANT
PACK CARDIAC CATH (CUSTOM PROCEDURE TRAY) ×3 IMPLANT
SHEATH AVANTI 5FR X 11CM (SHEATH) IMPLANT
WIRE GUIDERIGHT .035X150 (WIRE) IMPLANT
WIRE ROSEN-J .035X260CM (WIRE) ×2 IMPLANT

## 2017-12-21 ENCOUNTER — Encounter: Payer: Self-pay | Admitting: Internal Medicine

## 2018-01-26 ENCOUNTER — Other Ambulatory Visit: Payer: Self-pay

## 2018-01-26 ENCOUNTER — Encounter: Payer: Self-pay | Admitting: *Deleted

## 2018-01-26 ENCOUNTER — Emergency Department: Payer: BLUE CROSS/BLUE SHIELD

## 2018-01-26 DIAGNOSIS — R079 Chest pain, unspecified: Secondary | ICD-10-CM | POA: Diagnosis present

## 2018-01-26 DIAGNOSIS — Z87891 Personal history of nicotine dependence: Secondary | ICD-10-CM | POA: Insufficient documentation

## 2018-01-26 DIAGNOSIS — G8929 Other chronic pain: Secondary | ICD-10-CM | POA: Diagnosis not present

## 2018-01-26 DIAGNOSIS — E039 Hypothyroidism, unspecified: Secondary | ICD-10-CM | POA: Insufficient documentation

## 2018-01-26 DIAGNOSIS — Z79899 Other long term (current) drug therapy: Secondary | ICD-10-CM | POA: Diagnosis not present

## 2018-01-26 DIAGNOSIS — G44219 Episodic tension-type headache, not intractable: Secondary | ICD-10-CM | POA: Diagnosis not present

## 2018-01-26 LAB — BASIC METABOLIC PANEL WITH GFR
Anion gap: 7 (ref 5–15)
BUN: 12 mg/dL (ref 6–20)
CO2: 28 mmol/L (ref 22–32)
Calcium: 9 mg/dL (ref 8.9–10.3)
Chloride: 104 mmol/L (ref 101–111)
Creatinine, Ser: 1.22 mg/dL (ref 0.61–1.24)
GFR calc Af Amer: >60 mL/min
GFR calc non Af Amer: >60 mL/min
Glucose, Bld: 92 mg/dL (ref 65–99)
Potassium: 3.9 mmol/L (ref 3.5–5.1)
Sodium: 139 mmol/L (ref 135–145)

## 2018-01-26 LAB — CBC
HCT: 43.6 % (ref 40.0–52.0)
Hemoglobin: 14.7 g/dL (ref 13.0–18.0)
MCH: 29.4 pg (ref 26.0–34.0)
MCHC: 33.7 g/dL (ref 32.0–36.0)
MCV: 87.4 fL (ref 80.0–100.0)
Platelets: 265 10*3/uL (ref 150–440)
RBC: 4.99 MIL/uL (ref 4.40–5.90)
RDW: 13.1 % (ref 11.5–14.5)
WBC: 5.4 10*3/uL (ref 3.8–10.6)

## 2018-01-26 LAB — TROPONIN I: Troponin I: <0.03 ng/mL

## 2018-01-26 NOTE — ED Triage Notes (Addendum)
Pt to ED reporting a headache for the past three weeks with blurred vision and light sensitivity. No reported hx of migraines. Nausea but no vomiting. No fevers.   Pt also reports months of intermittent left sided sharp chest pain and dizziness. No cough. No SOB. Pt also reports sometimes the pain is "bubbly" in nature. Hx of a negative stress test and negative heart cath that pt reports was performed due to continued chest pains.

## 2018-01-27 ENCOUNTER — Emergency Department
Admission: EM | Admit: 2018-01-27 | Discharge: 2018-01-27 | Disposition: A | Discharge status: Home / Self Care | Payer: BLUE CROSS/BLUE SHIELD | Attending: Emergency Medicine | Admitting: Emergency Medicine

## 2018-01-27 ENCOUNTER — Emergency Department: Payer: BLUE CROSS/BLUE SHIELD

## 2018-01-27 DIAGNOSIS — R079 Chest pain, unspecified: Secondary | ICD-10-CM

## 2018-01-27 DIAGNOSIS — R51 Headache: Secondary | ICD-10-CM

## 2018-01-27 DIAGNOSIS — R519 Headache, unspecified: Secondary | ICD-10-CM

## 2018-01-27 DIAGNOSIS — G8929 Other chronic pain: Secondary | ICD-10-CM

## 2018-01-27 NOTE — ED Provider Notes (Signed)
[facility] Emergency Department Provider Note  ____________________________________________   First MD Initiated Contact with Patient 01/27/18 0209     (approximate)  I have reviewed the triage vital signs and the nursing notes.   HISTORY  Chief Complaint Chest Pain and Headache    HPI Austin Fletcher is a 32 y.o. male who has a history of chest pain over the last 3 months for which he has been evaluated by Dr. Juliann Pares including a heart catheterization that was reportedly clean and occurred last month.  He presents today for evaluation of the chest pain as well as for an intermittent headache that he has been experiencing over the last 3 weeks.  He says it occurs more than 50% of the time.  Nothing in particular makes it start, makes it better, nor makes it worse.  It can happen right after he gets up in the morning or while he is at work.  It feels like a global throbbing severe pain.  No pain radiates down into his neck.  He has some blurry vision and light sensitivity when it happens.  It is accompanied with nausea but no vomiting.  He denies fever/chills, neck stiffness, shortness of breath, nausea, vomiting, and abdominal pain.  He reports that he recently establish care with a family medicine doctor at [facility] in fact saw them yesterday.  He discussed the headache and they recommended that he start on an antidepressant given the chronic chest pain and the headaches but he refuses to take it because he says he is not depressed.  He has no history of migraines although his mother had migraines.  He is a former smoker and denies alcohol and drug use.  Of note, the patient reports that he is currently headache free.  Past Medical History:  Diagnosis Date  . Hypothyroid     There are no active problems to display for this patient.   Past Surgical History:  Procedure Laterality Date  . LEFT HEART CATH AND CORONARY ANGIOGRAPHY Left 12/20/2017   Procedure: LEFT  HEART CATH AND CORONARY ANGIOGRAPHY;  Surgeon: Alwyn Pea, MD;  Location: [facility] INVASIVE CV LAB;  Service: Cardiovascular;  Laterality: Left;    Prior to Admission medications   Medication Sig Start Date End Date Taking? Authorizing Provider  hydrOXYzine (ATARAX/VISTARIL) 25 MG tablet Take 1 tablet (25 mg total) by mouth 2 (two) times daily as needed for anxiety (shortness of breath). 12/07/17   Minna Antis, MD  ibuprofen (ADVIL,MOTRIN) 200 MG tablet Take 600 mg by mouth every 6 (six) hours as needed for headache or moderate pain.    [provider]  levothyroxine (SYNTHROID, LEVOTHROID) 100 MCG tablet Take 100 mcg by mouth daily before breakfast.  12/05/17   [provider]  meclizine (ANTIVERT) 25 MG tablet Take 1 tablet (25 mg total) by mouth 3 (three) times daily as needed for dizziness. Patient not taking: Reported on 12/13/2017 02/06/17   Jene Every, MD  sucralfate (CARAFATE) 1 g tablet Take 1 tablet (1 g total) by mouth 2 (two) times daily. Patient not taking: Reported on 12/13/2017 10/04/17   Rebecka Apley, MD    Allergies Patient has no known allergies.  History reviewed. No pertinent family history.  Social History Social History   Tobacco Use  . Smoking status: Former Smoker    Packs/day: 1.00    Types: Cigarettes  . Smokeless tobacco: Never Used  . Tobacco comment: july 2018  Substance Use Topics  .  Alcohol use: No  . Drug use: No    Review of Systems Constitutional: No fever/chills Eyes: Blurry vision with headache ENT: No sore throat. Cardiovascular: Chronic chest pain for months Respiratory: Denies shortness of breath. Gastrointestinal: No abdominal pain.  Nausea associated with headache, no vomiting.  No diarrhea.  No constipation. Genitourinary: Negative for dysuria. Musculoskeletal: Negative for neck pain.  Negative for back pain. Integumentary: Negative for rash. Neurological: Approximately 3 weeks of intermittent  globalized headache with photosensitivity as described above.  No focal numbness nor weakness.   ____________________________________________   PHYSICAL EXAM:  VITAL SIGNS: ED Triage Vitals  Enc Vitals Group     BP 01/26/18 2146 133/90     Pulse Rate 01/26/18 2146 (!) 56     Resp 01/26/18 2146 16     Temp 01/26/18 2146 98.4 F (36.9 C)     Temp Source 01/26/18 2146 Oral     SpO2 01/26/18 2146 99 %     Weight 01/26/18 2144 78 kg (172 lb)     Height 01/26/18 2144 1.829 m (6')     Head Circumference --      Peak Flow --      Pain Score 01/26/18 2142 8     Pain Loc --      Pain Edu? --      Excl. in GC? --     Constitutional: Alert and oriented. Well appearing and in no acute distress. Eyes: Conjunctivae are normal. PERRL. EOMI. Head: Atraumatic. Nose: No congestion/rhinnorhea. Mouth/Throat: Mucous membranes are moist. Neck: No stridor.  No meningeal signs.   Cardiovascular: Normal rate, regular rhythm. Good peripheral circulation. Grossly normal heart sounds. Respiratory: Normal respiratory effort.  No retractions. Lungs CTAB. Gastrointestinal: Soft and nontender. No distention.  Musculoskeletal: No lower extremity tenderness nor edema. No gross deformities of extremities. Neurologic:  Normal speech and language. No gross focal neurologic deficits are appreciated.  Skin:  Skin is warm, dry and intact. No rash noted. Psychiatric: Mood and affect are normal. Speech and behavior are normal.  ____________________________________________   LABS (all labs ordered are listed, but only abnormal results are displayed)  Labs Reviewed  BASIC METABOLIC PANEL  CBC  TROPONIN I   ____________________________________________  EKG  ED ECG REPORT I, Loleta Roseory Beonca Gibb, the attending physician, personally viewed and interpreted this ECG.  Date: 01/26/2018 EKG Time: 21: 49 Rate: 46 Rhythm: Sinus bradycardia QRS Axis: normal Intervals: normal ST/T Wave abnormalities:  normal Narrative Interpretation: no evidence of acute ischemia  ____________________________________________  RADIOLOGY   ED MD interpretation: No indication of acute abnormalities on chest x-ray nor head CT  Official radiology report(s): Dg Chest 2 View  Result Date: 01/26/2018 CLINICAL DATA:  Left-sided chest pain for months. EXAM: CHEST - 2 VIEW COMPARISON:  12/07/2017 FINDINGS: The heart size and mediastinal contours are within normal limits. Both lungs are clear. The visualized skeletal structures are unremarkable. IMPRESSION: No active cardiopulmonary disease. Electronically Signed   By: Tollie Ethavid  Kwon M.D.   On: 01/26/2018 22:14   Ct Head Wo Contrast  Result Date: 01/27/2018 CLINICAL DATA:  Right frontal headache. EXAM: CT HEAD WITHOUT CONTRAST TECHNIQUE: Contiguous axial images were obtained from the base of the skull through the vertex without intravenous contrast. COMPARISON:  Head CT 02/06/2017 FINDINGS: Brain: No intracranial hemorrhage, mass effect, or midline shift. No hydrocephalus. The basilar cisterns are patent. No evidence of territorial infarct or acute ischemia. No extra-axial or intracranial fluid collection. Vascular: No hyperdense vessel or unexpected calcification. Skull: No  fracture or focal lesion. Sinuses/Orbits: Paranasal sinuses and mastoid air cells are clear. The visualized orbits are unremarkable. Other: None. IMPRESSION: Unremarkable noncontrast head CT. Electronically Signed   By: Rubye Oaks M.D.   On: 01/27/2018 02:55    ____________________________________________   PROCEDURES  Critical Care performed: No   Procedure(s) performed:   Procedures   ____________________________________________   INITIAL IMPRESSION / ASSESSMENT AND PLAN / ED COURSE  As part of my medical decision making, I reviewed the following data within the electronic MEDICAL RECORD NUMBER Nursing notes reviewed and incorporated, Labs reviewed , EKG interpreted , Old chart  reviewed, Radiograph reviewed  and Notes from prior ED visits    Differential diagnosis includes, but is not limited to, stable angina, ACS/unstable angina, pneumothorax, pneumonia, pericarditis, etc.  His EKG is reassuring and he has had an extensive and invasive cardiac workup with no indication of a specific cause of his chest pain.  I believe it is noncardiac in nature and he is PERC negative.  There is no indication for further workup at this time for his chronic chest pain and I encouraged him to follow-up with Dr. Juliann Pares.  His lab work is all also with an normal limits including metabolic panel, CBC, and troponin.  His vital signs are stable and notable only for bradycardia which is likely a result of his age and physical condition.  Regarding his headache, Differential diagnosis includes, but is not limited to, intracranial hemorrhage, meningitis/encephalitis, previous head trauma, cavernous venous thrombosis, tension headache, temporal arteritis, migraine or migraine equivalent, idiopathic intracranial hypertension, and non-specific headache.  However I believe his symptoms are most suggestive of intermittent but recurrent migraine.  He is currently without a headache and I explained to him I cannot treat a migraine if he is not currently experiencing it.  We did have my usual headache discussion and I encouraged him to follow-up with his primary care doctor and consider the treatment that she recommended.  I explained that for chronic symptoms and concerns, the primary care doctor is the best one with him to follow-up and he understands and agrees with the plan.  No indication for further imaging or treatment tonight.  I gave my usual and customary return precautions.       ____________________________________________  FINAL CLINICAL IMPRESSION(S) / ED DIAGNOSES  Final diagnoses:  Chronic chest pain  Nonintractable episodic headache, unspecified headache type     MEDICATIONS GIVEN  DURING THIS VISIT:  Medications - No data to display   ED Discharge Orders    None       Note:  This document was prepared using Dragon voice recognition software and may include unintentional dictation errors.    Loleta Rose, MD 01/27/18 [id number]

## 2018-01-27 NOTE — ED Notes (Signed)
Pt states left sided chest pain for over three months. Pt states he has had right sided frontal headache with sensitivity to light for over 2 weeks. Pt denies other symptoms and appears in no acute distress.

## 2018-01-27 NOTE — Discharge Instructions (Signed)
Your workup in the Emergency Department today was reassuring.  We did not find any specific abnormalities.  We recommend you drink plenty of fluids, take your regular medications and/or any new ones prescribed today, and follow up with the doctor(s) listed in these documents as recommended.  Return to the Emergency Department if you develop new or worsening symptoms that concern you.  

## 2019-04-24 DIAGNOSIS — Z87891 Personal history of nicotine dependence: Secondary | ICD-10-CM | POA: Diagnosis not present

## 2019-04-24 DIAGNOSIS — R42 Dizziness and giddiness: Secondary | ICD-10-CM | POA: Diagnosis not present

## 2019-04-24 DIAGNOSIS — I208 Other forms of angina pectoris: Secondary | ICD-10-CM | POA: Diagnosis not present

## 2019-04-24 DIAGNOSIS — E0590 Thyrotoxicosis, unspecified without thyrotoxic crisis or storm: Secondary | ICD-10-CM | POA: Diagnosis not present

## 2019-07-19 IMAGING — CR DG CHEST 2V
2 series · 2 of 2 positions shown · non-contrast
Comparison: 10/05/2017

CLINICAL DATA: Sudden onset of chest pain radiating to LEFT
shoulder associated with shortness of breath while driving to work,
similar symptoms a few months ago

EXAM:
CHEST  2 VIEW

[chest pa]
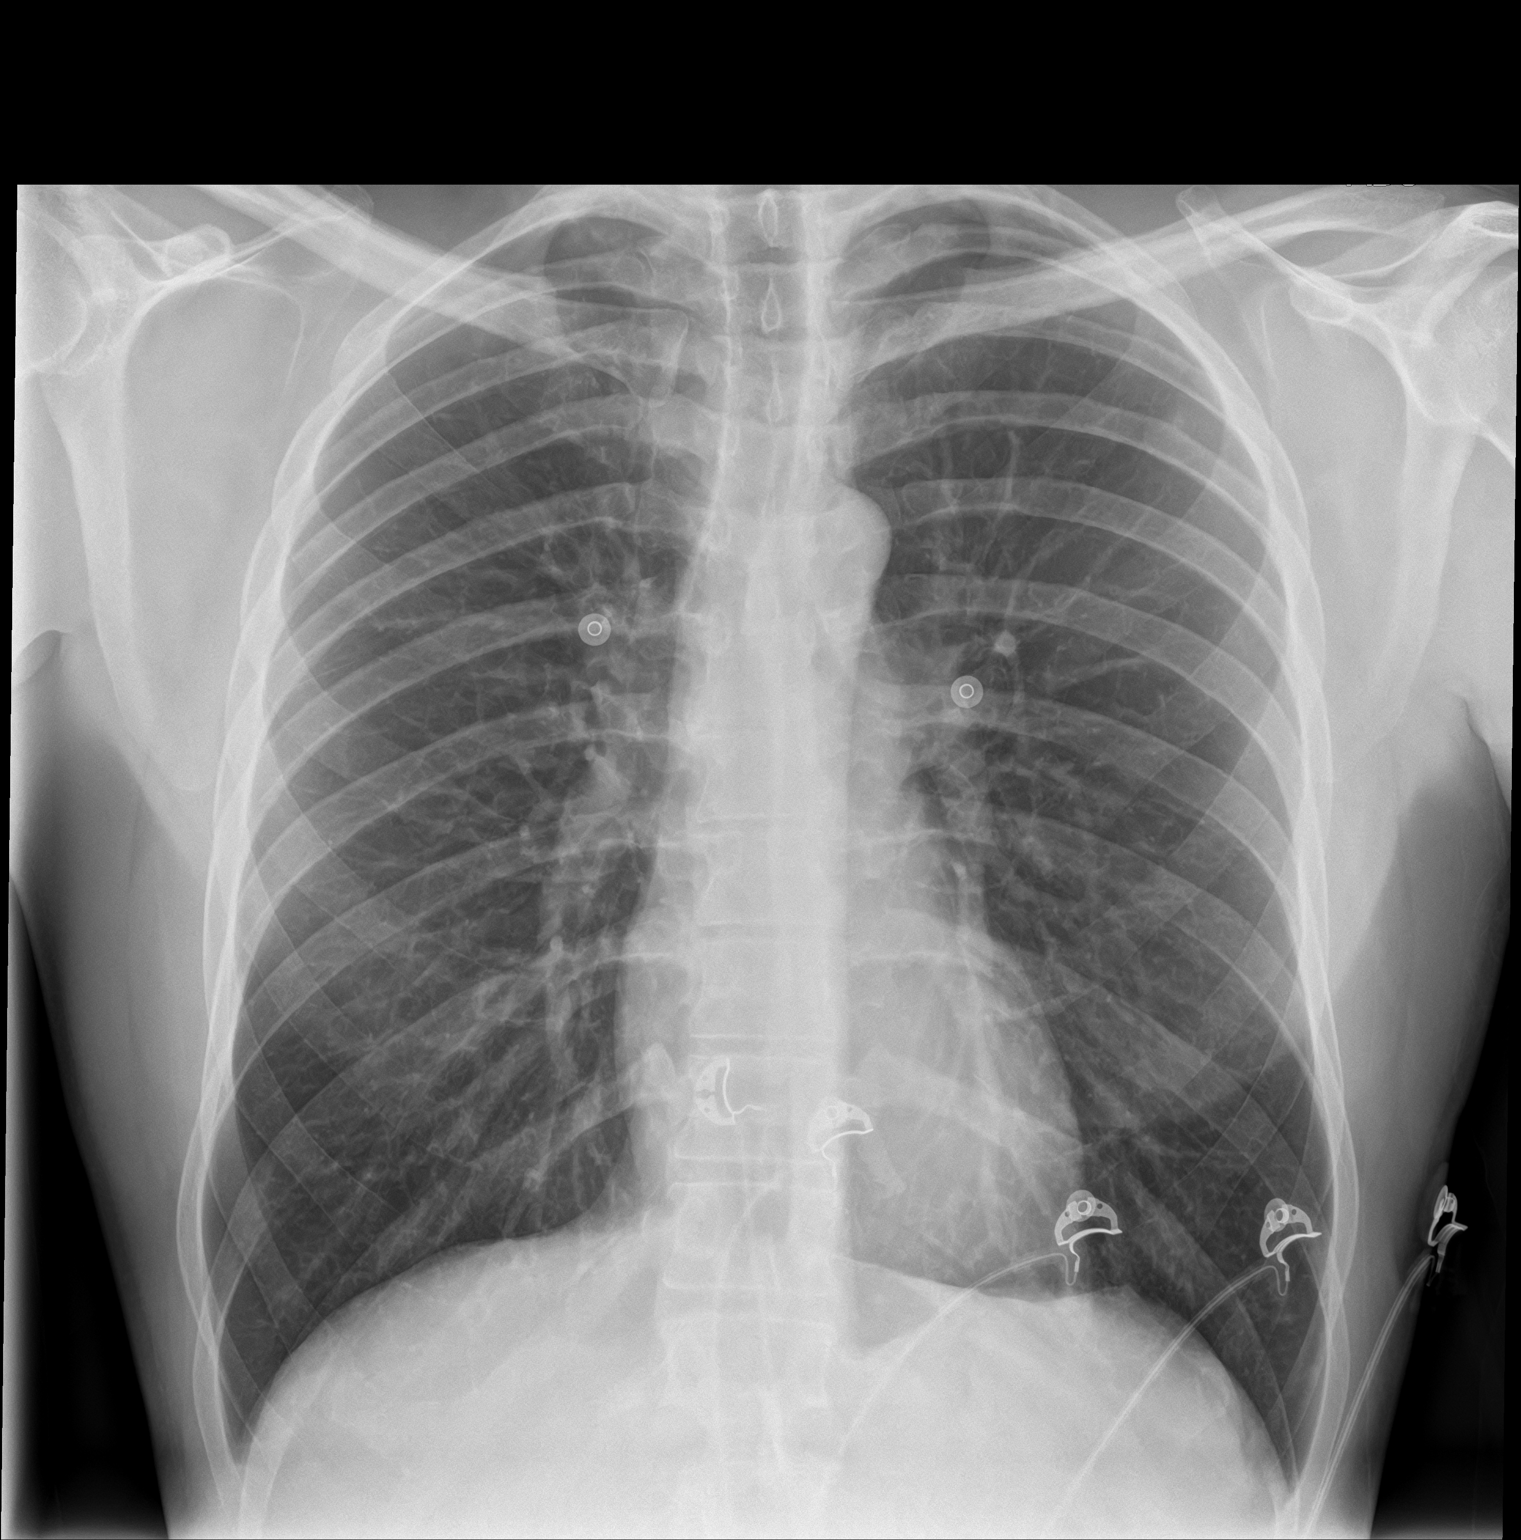

[chest lat]
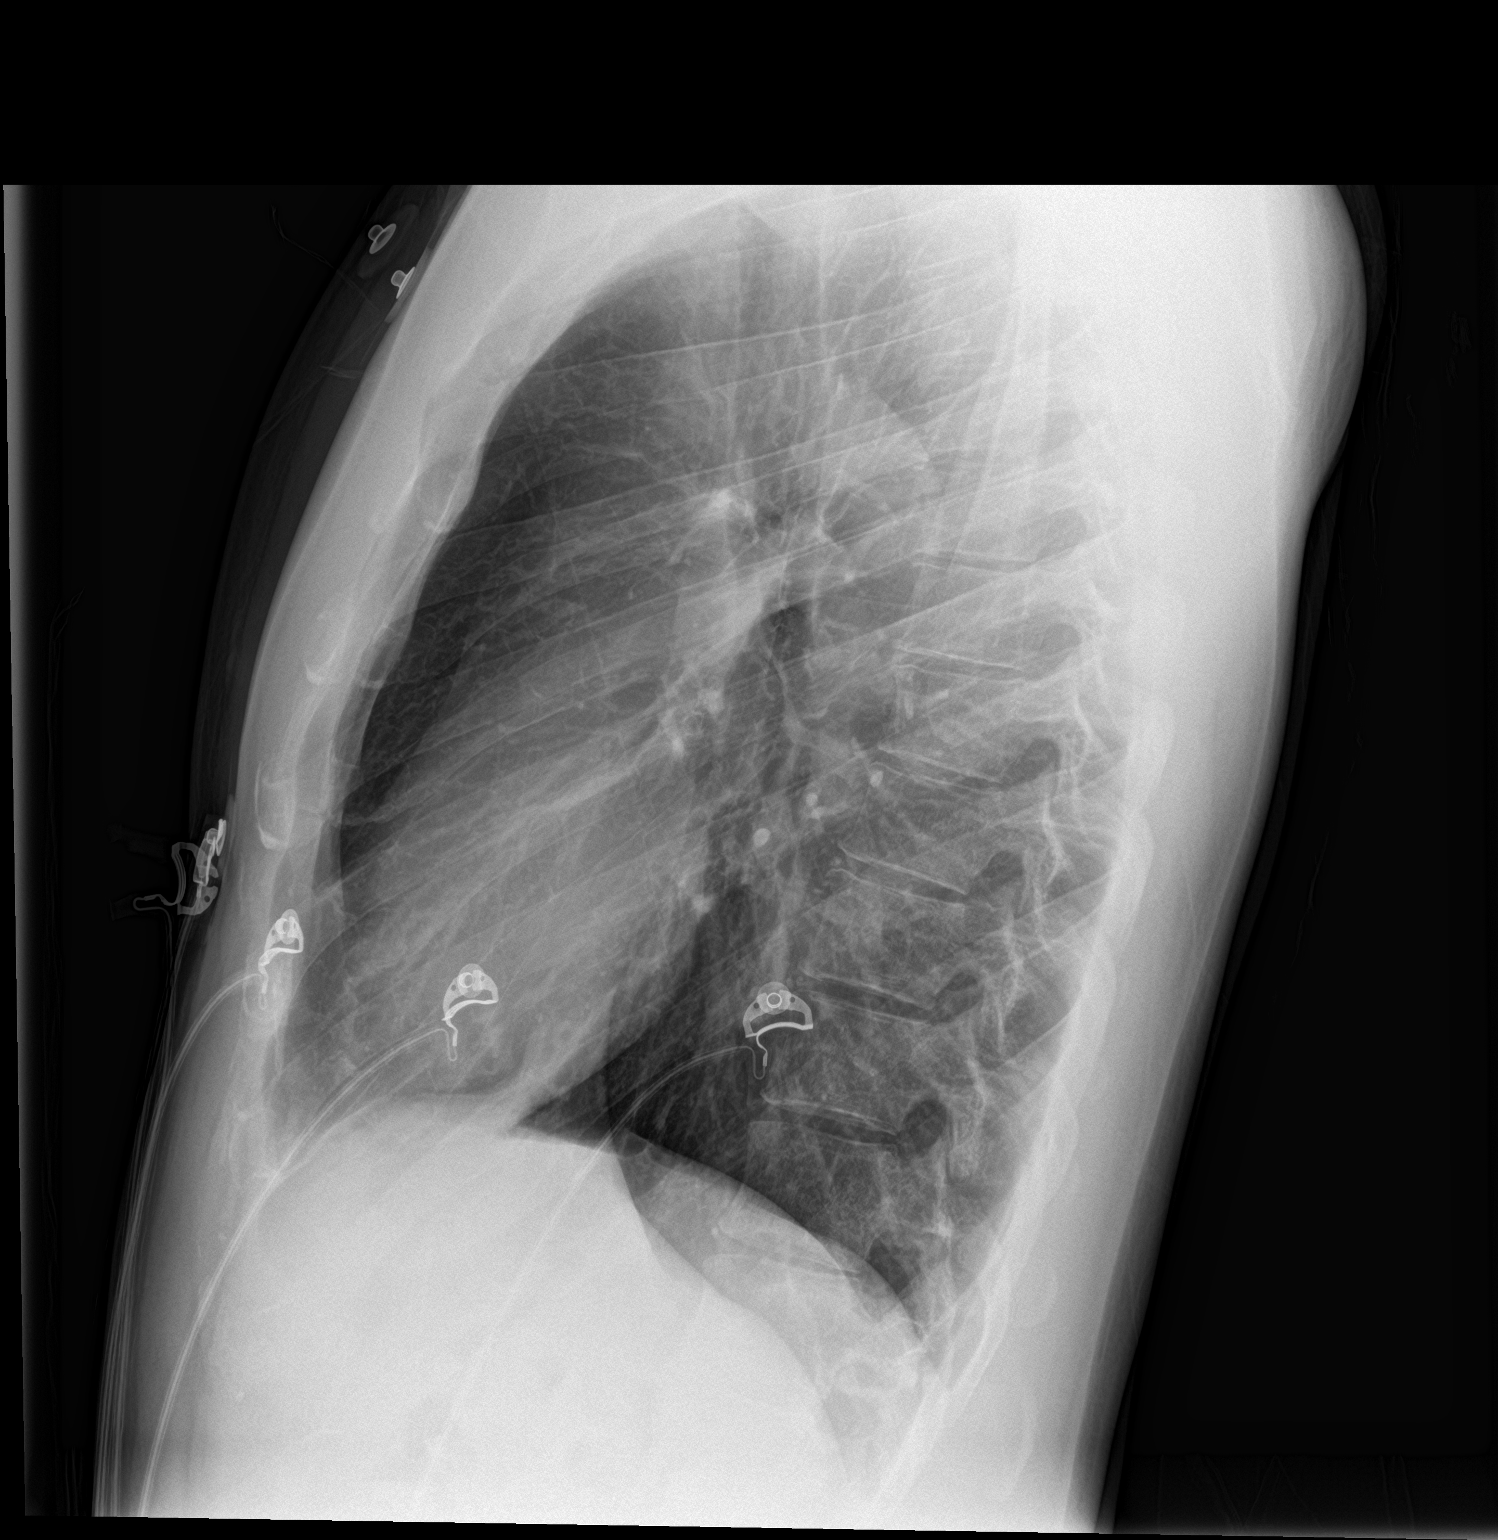

[2 of 2 positions shown; findings below may reference images not displayed]

FINDINGS: Normal heart size, mediastinal contours, and pulmonary vascularity.

Lungs clear.

No pleural effusion or pneumothorax.

Bones unremarkable.
IMPRESSION: Normal exam.

## 2019-08-01 ENCOUNTER — Ambulatory Visit: Payer: Self-pay | Admitting: Physician Assistant

## 2019-09-13 DIAGNOSIS — Z20828 Contact with and (suspected) exposure to other viral communicable diseases: Secondary | ICD-10-CM | POA: Diagnosis not present

## 2019-10-11 DIAGNOSIS — Z20828 Contact with and (suspected) exposure to other viral communicable diseases: Secondary | ICD-10-CM | POA: Diagnosis not present

## 2019-11-14 DIAGNOSIS — Z20828 Contact with and (suspected) exposure to other viral communicable diseases: Secondary | ICD-10-CM | POA: Diagnosis not present

## 2019-12-30 DIAGNOSIS — Z20822 Contact with and (suspected) exposure to covid-19: Secondary | ICD-10-CM | POA: Diagnosis not present

## 2020-01-30 DIAGNOSIS — Z20822 Contact with and (suspected) exposure to covid-19: Secondary | ICD-10-CM | POA: Diagnosis not present

## 2020-01-30 DIAGNOSIS — M940 Chondrocostal junction syndrome [Tietze]: Secondary | ICD-10-CM | POA: Diagnosis not present

## 2020-01-30 DIAGNOSIS — J069 Acute upper respiratory infection, unspecified: Secondary | ICD-10-CM | POA: Diagnosis not present

## 2020-03-26 DIAGNOSIS — J069 Acute upper respiratory infection, unspecified: Secondary | ICD-10-CM | POA: Diagnosis not present

## 2020-03-26 DIAGNOSIS — Z20822 Contact with and (suspected) exposure to covid-19: Secondary | ICD-10-CM | POA: Diagnosis not present

## 2020-07-14 DIAGNOSIS — R079 Chest pain, unspecified: Secondary | ICD-10-CM | POA: Diagnosis not present

## 2020-07-14 DIAGNOSIS — E0590 Thyrotoxicosis, unspecified without thyrotoxic crisis or storm: Secondary | ICD-10-CM | POA: Diagnosis not present

## 2020-07-14 DIAGNOSIS — R001 Bradycardia, unspecified: Secondary | ICD-10-CM | POA: Diagnosis not present

## 2020-07-14 DIAGNOSIS — R0789 Other chest pain: Secondary | ICD-10-CM | POA: Diagnosis not present

## 2020-07-14 DIAGNOSIS — Z87891 Personal history of nicotine dependence: Secondary | ICD-10-CM | POA: Diagnosis not present

## 2020-07-14 DIAGNOSIS — Z20822 Contact with and (suspected) exposure to covid-19: Secondary | ICD-10-CM | POA: Diagnosis not present

## 2020-07-14 DIAGNOSIS — R42 Dizziness and giddiness: Secondary | ICD-10-CM | POA: Diagnosis not present

## 2020-07-14 DIAGNOSIS — Z79899 Other long term (current) drug therapy: Secondary | ICD-10-CM | POA: Diagnosis not present

## 2020-07-14 DIAGNOSIS — K429 Umbilical hernia without obstruction or gangrene: Secondary | ICD-10-CM | POA: Diagnosis not present

## 2020-07-14 DIAGNOSIS — R200 Anesthesia of skin: Secondary | ICD-10-CM | POA: Diagnosis not present

## 2020-11-16 ENCOUNTER — Ambulatory Visit: Payer: Self-pay | Admitting: Adult Health

## 2020-11-26 ENCOUNTER — Encounter: Payer: Self-pay | Admitting: Family Medicine

## 2020-11-26 ENCOUNTER — Other Ambulatory Visit: Payer: Self-pay

## 2020-11-26 ENCOUNTER — Ambulatory Visit: Payer: BC Managed Care – PPO | Admitting: Family Medicine

## 2020-11-26 VITALS — BP 141/93 | HR 73 | Temp 98.1°F

## 2020-11-26 DIAGNOSIS — K4090 Unilateral inguinal hernia, without obstruction or gangrene, not specified as recurrent: Secondary | ICD-10-CM

## 2020-11-26 DIAGNOSIS — K219 Gastro-esophageal reflux disease without esophagitis: Secondary | ICD-10-CM

## 2020-11-26 DIAGNOSIS — R059 Cough, unspecified: Secondary | ICD-10-CM | POA: Diagnosis not present

## 2020-11-26 DIAGNOSIS — Z1329 Encounter for screening for other suspected endocrine disorder: Secondary | ICD-10-CM | POA: Diagnosis not present

## 2020-11-26 DIAGNOSIS — R1012 Left upper quadrant pain: Secondary | ICD-10-CM

## 2020-11-26 NOTE — Patient Instructions (Signed)
Peptic Ulcer  A peptic ulcer is a sore in the lining of the stomach (gastric ulcer) or the first part of the small intestine (duodenal ulcer). The ulcer causes a gradual wearing away (erosion) of the deeper tissue. What are the causes? Normally, the lining of the stomach and the small intestine protects them from the acid that digests food. The protective lining can be damaged by:  An infection caused by a type of bacteria called Helicobacter pylori or H. pylori.  Regular use of NSAIDs, such as ibuprofen or aspirin.  Rare tumors in the stomach, small intestine, or pancreas (Zollinger-Ellison syndrome). What increases the risk? The following factors may make you more likely to develop this condition:  Smoking.  Having a family history of ulcer disease.  Drinking alcohol.  Having been hospitalized in an intensive care unit (ICU). What are the signs or symptoms? Symptoms of this condition include:  Persistent burning pain in the area between the chest and the belly button. The pain may be worse on an empty stomach and at night.  Heartburn.  Nausea and vomiting.  Bloating. If the ulcer results in bleeding, it can cause:  Black, tarry stools.  Vomiting of bright red blood.  Vomiting of material that looks like coffee grounds. How is this diagnosed? This condition may be diagnosed based on:  Your medical history and a physical exam.  Various tests or procedures, such as: ? Blood tests, stool tests, or breath tests to check for the H. pylori bacteria. ? An X-ray exam (upper gastrointestinal series) of the esophagus, stomach, and small intestine. ? Upper endoscopy. The health care provider examines the esophagus, stomach, and small intestine using a small flexible tube that has a video camera at the end. ? Biopsy. A tissue sample is removed to be examined under a microscope. How is this treated? Treatment for this condition may include:  Eliminating the cause of the ulcer,  such as smoking or use of NSAIDs, and limiting alcohol and caffeine intake.  Medicines to reduce the amount of acid in your digestive tract.  Antibiotic medicines, if the ulcer is caused by an H. pylori infection.  An upper endoscopy may be used to treat a bleeding ulcer.  Surgery. This may be needed if the bleeding is severe or if the ulcer created a hole somewhere in the digestive system. Follow these instructions at home:  Do not drink alcohol if your health care provider tells you not to drink.  Do not use any products that contain nicotine or tobacco, such as cigarettes, e-cigarettes, and chewing tobacco. If you need help quitting, ask your health care provider.  Take over-the-counter and prescription medicines only as told by your health care provider. ? Do not use over-the-counter medicines in place of prescription medicines unless your health care provider approves. ? Do not take aspirin, ibuprofen, or other NSAIDs unless your health care provider told you to do so.  Take over-the-counter and prescription medicines only as told by your health care provider.  Keep all follow-up visits as told by your health care provider. This is important. Contact a health care provider if:  Your symptoms do not improve within 7 days of starting treatment.  You have ongoing indigestion or heartburn. Get help right away if:  You have sudden, sharp, or persistent pain in your abdomen.  You have bloody or dark black, tarry stools.  You vomit blood or material that looks like coffee grounds.  You become light-headed or you feel faint.    You become weak.  You become sweaty or clammy. Summary  A peptic ulcer is a sore in the lining of the stomach (gastric ulcer) or the first part of the small intestine (duodenal ulcer). The ulcer causes a gradual wearing away (erosion) of the deeper tissue.  Do not use any products that contain nicotine or tobacco, such as cigarettes, e-cigarettes, and  chewing tobacco. If you need help quitting, ask your health care provider.  Take over-the-counter and prescription medicines only as told by your health care provider. Do not use over-the-counter medicines in place of prescription medicines unless your health care provider approves.  Contact your health care provider if you have ongoing indigestion or heartburn.  Keep all follow-up visits as told by your health care provider. This is important. This information is not intended to replace advice given to you by your health care provider. Make sure you discuss any questions you have with your health care provider. Document Revised: 05/01/2018 Document Reviewed: 05/01/2018 Elsevier Patient Education  2021 Elsevier Inc.  

## 2020-11-26 NOTE — Progress Notes (Signed)
New patient visit   Patient: Austin Fletcher   DOB: 1986/01/01   35 y.o. Male  MRN: [id number] Visit Date: 11/26/2020  Today's healthcare provider: Dortha Kern, PA-C   Chief Complaint  Patient presents with  . Establish Care  . Nasal Congestion  . Abdominal Pain   Subjective    Austin Fletcher is a 35 y.o. male who presents today as a new patient to establish care.  HPI  Nasal Congestion: Patient complains of stuffy nose for the past 3 weeks. He denies a cough, loss of taste, fever, headache or body aches. History of springtime allergies..   Abdominal pain: Patient complains of abdominal pain and left groin pain for several months. Slight relief with Prilosec. Usually some sharp pain into the left side. No hematuria, diarrhea, melena, vomiting or heartburn. Some gas/burping.   Past Medical History:  Diagnosis Date  . Anxiety   . Hypothyroid    Past Surgical History:  Procedure Laterality Date  . LEFT HEART CATH AND CORONARY ANGIOGRAPHY Left 12/20/2017   Procedure: LEFT HEART CATH AND CORONARY ANGIOGRAPHY;  Surgeon: Alwyn Pea, MD;  Location: [facility] INVASIVE CV LAB;  Service: Cardiovascular;  Laterality: Left;   Family Status  Relation Name Status  . Mother  (Not Specified)  . Father  (Not Specified)  . MGM  (Not Specified)  . PGM  (Not Specified)  . Sib  Other   Family History  Problem Relation Age of Onset  . Hernia Mother   . Hypertension Father   . Heart attack Maternal Grandmother   . Stroke Paternal Grandmother   . Heart attack Sibling    Social History   Socioeconomic History  . Marital status: Legally Separated    Spouse name: Not on file  . Number of children: Not on file  . Years of education: Not on file  . Highest education level: Not on file  Occupational History  . Not on file  Tobacco Use  . Smoking status: Former Smoker    Packs/day: 1.00    Types: Cigarettes  . Smokeless tobacco: Never Used  . Tobacco comment: july 2018   Vaping Use  . Vaping Use: Never used  Substance and Sexual Activity  . Alcohol use: No  . Drug use: No  . Sexual activity: Yes  Other Topics Concern  . Not on file  Social History Narrative  . Not on file   Social Determinants of Health   Financial Resource Strain: Not on file  Food Insecurity: Not on file  Transportation Needs: Not on file  Physical Activity: Not on file  Stress: Not on file  Social Connections: Not on file   Outpatient Medications Prior to Visit  Medication Sig  . aspirin EC 81 MG tablet Take 81 mg by mouth daily. Swallow whole.  . [DISCONTINUED] hydrOXYzine (ATARAX/VISTARIL) 25 MG tablet Take 1 tablet (25 mg total) by mouth 2 (two) times daily as needed for anxiety (shortness of breath). (Patient not taking: Reported on 11/26/2020)  . [DISCONTINUED] ibuprofen (ADVIL,MOTRIN) 200 MG tablet Take 600 mg by mouth every 6 (six) hours as needed for headache or moderate pain.  . [DISCONTINUED] levothyroxine (SYNTHROID, LEVOTHROID) 100 MCG tablet Take 100 mcg by mouth daily before breakfast.  (Patient not taking: Reported on 11/26/2020)  . [DISCONTINUED] meclizine (ANTIVERT) 25 MG tablet Take 1 tablet (25 mg total) by mouth 3 (three) times daily as needed for dizziness. (Patient not taking: Reported on 12/13/2017)  . [DISCONTINUED] sucralfate (CARAFATE)  1 g tablet Take 1 tablet (1 g total) by mouth 2 (two) times daily. (Patient not taking: Reported on 12/13/2017)   No facility-administered medications prior to visit.   No Known Allergies   There is no immunization history on file for this patient.  Health Maintenance  Topic Date Due  . Hepatitis C Screening  Never done  . COVID-19 Vaccine (1) Never done  . HIV Screening  Never done  . INFLUENZA VACCINE  Never done  . TETANUS/TDAP  01/09/2028    Patient Care Team: Jonavin Seder, Maryjean Morn as PCP - General (Family Medicine)  Review of Systems  Constitutional: Positive for fatigue. Negative for appetite change,  chills and fever.  HENT: Positive for congestion (nasal congestion), sneezing and sore throat. Negative for ear pain, hearing loss, nosebleeds and trouble swallowing.   Eyes: Negative for pain and visual disturbance.  Respiratory: Negative for cough, chest tightness and shortness of breath.   Cardiovascular: Positive for chest pain. Negative for palpitations and leg swelling.  Gastrointestinal: Positive for abdominal pain. Negative for blood in stool, constipation, diarrhea, nausea and vomiting.  Endocrine: Negative for polydipsia, polyphagia and polyuria.  Genitourinary: Negative for dysuria and flank pain.  Musculoskeletal: Negative for arthralgias, back pain, joint swelling, myalgias and neck stiffness.  Skin: Negative for color change, rash and wound.  Neurological: Negative for dizziness, tremors, seizures, speech difficulty, weakness, light-headedness and headaches.  Psychiatric/Behavioral: Negative for behavioral problems, confusion, decreased concentration, dysphoric mood and sleep disturbance. The patient is not nervous/anxious.   All other systems reviewed and are negative.     Objective    BP (!) 141/93   Pulse 73   Temp 98.1 F (36.7 C)   SpO2 98%  Physical Exam Constitutional:      General: He is not in acute distress.    Appearance: He is well-developed and well-nourished.  HENT:     Head: Normocephalic and atraumatic.     Right Ear: Hearing normal.     Left Ear: Hearing normal.     Nose: Nose normal.  Eyes:     General: Lids are normal. No scleral icterus.       Right eye: No discharge.        Left eye: No discharge.     Conjunctiva/sclera: Conjunctivae normal.  Pulmonary:     Effort: Pulmonary effort is normal. No respiratory distress.  Musculoskeletal:        General: Normal range of motion.  Skin:    General: Skin is intact.     Findings: No lesion or rash.  Neurological:     Mental Status: He is alert and oriented to person, place, and time.   Psychiatric:        Mood and Affect: Mood and affect normal.        Speech: Speech normal.        Behavior: Behavior normal.        Thought Content: Thought content normal.    Depression Screen PHQ 2/9 Scores 11/26/2020  PHQ - 2 Score 1  PHQ- 9 Score 3   No results found for any visits on 11/26/20.  Assessment & Plan     1. Cough No fever, sputum production, sore throat, loss of taste or dyspnea. Suspect allergic rhinitis. May use Claritin or Allegra OTC with Flonase nasal spray. - CBC with Differential/Platelet  2. LUQ abdominal pain Some relief with use of Prilosec. Denies melena or hematochezia. Complains of GERD symptoms. Will check CBC, CMP and H.pylori  breath test. Continue PPI for a month. If discomfort persists, will need to consider GI consult. - CBC with Differential/Platelet - Comprehensive metabolic panel - H. pylori breath test  3. Left inguinal hernia Mild with out discomfort and easily reducible. May need surgical consult if it becomes symptomatic.  4. Thyroid disorder screening History of thyroid disease, but, has been off Levothyroxine. Will recheck labs to assess condition. - CBC with Differential/Platelet - TSH - T4   No follow-ups on file.     I, Valarie Farace, PA-C, have reviewed all documentation for this visit. The documentation on 11/26/20 for the exam, diagnosis, procedures, and orders are all accurate and complete.    Dortha Kern, PA-C  [employer] [phone] (phone) [phone] (fax)  [facility]

## 2020-11-28 LAB — SARS-COV-2, NAA 2 DAY TAT

## 2020-11-28 LAB — NOVEL CORONAVIRUS, NAA: SARS-CoV-2, NAA: NOT DETECTED

## 2020-12-11 DIAGNOSIS — R1012 Left upper quadrant pain: Secondary | ICD-10-CM | POA: Diagnosis not present

## 2020-12-11 DIAGNOSIS — R059 Cough, unspecified: Secondary | ICD-10-CM | POA: Diagnosis not present

## 2020-12-11 DIAGNOSIS — Z1329 Encounter for screening for other suspected endocrine disorder: Secondary | ICD-10-CM | POA: Diagnosis not present

## 2020-12-12 LAB — COMPREHENSIVE METABOLIC PANEL
BUN/Creatinine Ratio: 11 (ref 9–20)
BUN: 14 mg/dL (ref 6–20)
GFR calc Af Amer: 80 mL/min/{1.73_m2} (ref >59)
GFR calc non Af Amer: 69 mL/min/{1.73_m2} (ref >59)
Globulin, Total: 2.5 g/dL (ref 1.5–4.5)

## 2020-12-12 LAB — TSH: TSH: 2.54 u[IU]/mL (ref 0.450–4.500)

## 2020-12-12 LAB — CBC WITH DIFFERENTIAL/PLATELET
Basos: 1 %
Hematocrit: 44.4 % (ref 37.5–51.0)
Immature Grans (Abs): 0 10*3/uL (ref 0.0–0.1)
MCHC: 33.8 g/dL (ref 31.5–35.7)

## 2020-12-13 LAB — CBC WITH DIFFERENTIAL/PLATELET
Basophils Absolute: 0.1 10*3/uL (ref 0.0–0.2)
EOS (ABSOLUTE): 0.1 10*3/uL (ref 0.0–0.4)
Eos: 2 %
Hemoglobin: 15 g/dL (ref 13.0–17.7)
Immature Granulocytes: 0 %
Lymphocytes Absolute: 1.5 10*3/uL (ref 0.7–3.1)
Lymphs: 23 %
MCH: 30.7 pg (ref 26.6–33.0)
MCV: 91 fL (ref 79–97)
Monocytes Absolute: 0.5 10*3/uL (ref 0.1–0.9)
Monocytes: 8 %
Neutrophils Absolute: 4.3 10*3/uL (ref 1.4–7.0)
Neutrophils: 66 %
Platelets: 286 10*3/uL (ref 150–450)
RBC: 4.88 x10E6/uL (ref 4.14–5.80)
RDW: 12.9 % (ref 11.6–15.4)
WBC: 6.4 10*3/uL (ref 3.4–10.8)

## 2020-12-13 LAB — COMPREHENSIVE METABOLIC PANEL
ALT: 14 IU/L (ref 0–44)
AST: 21 IU/L (ref 0–40)
Albumin/Globulin Ratio: 1.8 (ref 1.2–2.2)
Albumin: 4.6 g/dL (ref 4.0–5.0)
Alkaline Phosphatase: 69 IU/L (ref 44–121)
Bilirubin Total: 0.5 mg/dL (ref 0.0–1.2)
CO2: 25 mmol/L (ref 20–29)
Calcium: 9.4 mg/dL (ref 8.7–10.2)
Chloride: 106 mmol/L (ref 96–106)
Creatinine, Ser: 1.32 mg/dL — ABNORMAL HIGH (ref 0.76–1.27)
Glucose: 78 mg/dL (ref 65–99)
Potassium: 4.7 mmol/L (ref 3.5–5.2)
Sodium: 147 mmol/L — ABNORMAL HIGH (ref 134–144)
Total Protein: 7.1 g/dL (ref 6.0–8.5)

## 2020-12-13 LAB — T4: T4, Total: 7.8 ug/dL (ref 4.5–12.0)

## 2020-12-13 LAB — H. PYLORI BREATH TEST: H pylori Breath Test: NEGATIVE

## 2021-01-21 DIAGNOSIS — Z87891 Personal history of nicotine dependence: Secondary | ICD-10-CM | POA: Diagnosis not present

## 2021-01-21 DIAGNOSIS — R42 Dizziness and giddiness: Secondary | ICD-10-CM | POA: Diagnosis not present

## 2021-01-21 DIAGNOSIS — Z6825 Body mass index (BMI) 25.0-25.9, adult: Secondary | ICD-10-CM | POA: Diagnosis not present

## 2021-01-21 DIAGNOSIS — E0590 Thyrotoxicosis, unspecified without thyrotoxic crisis or storm: Secondary | ICD-10-CM | POA: Diagnosis not present

## 2021-01-21 DIAGNOSIS — R0789 Other chest pain: Secondary | ICD-10-CM | POA: Diagnosis not present

## 2021-01-21 DIAGNOSIS — Z8249 Family history of ischemic heart disease and other diseases of the circulatory system: Secondary | ICD-10-CM | POA: Diagnosis not present

## 2021-01-21 DIAGNOSIS — Z809 Family history of malignant neoplasm, unspecified: Secondary | ICD-10-CM | POA: Diagnosis not present

## 2021-01-21 DIAGNOSIS — R079 Chest pain, unspecified: Secondary | ICD-10-CM | POA: Diagnosis not present

## 2021-01-21 DIAGNOSIS — R142 Eructation: Secondary | ICD-10-CM | POA: Diagnosis not present

## 2022-03-28 ENCOUNTER — Ambulatory Visit: Payer: Self-pay

## 2022-03-28 NOTE — Telephone Encounter (Signed)
    Chief Complaint: Left lower side Symptoms: Pain, dizzy Frequency: Started "several years ago." But getting worse. Pertinent Negatives: Patient denies any other symptoms. Disposition: [] ED /[] Urgent Care (no appt availability in office) / [x] Appointment(In office/virtual)/ []  [facility]/ [] Home Care/ [] Refused Recommended Disposition /[] [place] [facility]/ []  Follow-up with PCP Additional Notes: Instructed to call back for worsening of symptoms.  Reason for Disposition  Abdominal pain is a chronic symptom (recurrent or ongoing AND present > 4 weeks)  Answer Assessment - Initial Assessment Questions 1. LOcATION: "Where does it hurt?"      Left side  2. RADIATION: "Does the pain shoot anywhere else?" (e.g., chest, back)     Side 3. ONSET: "When did the pain begin?" (Minutes, hours or days ago)      2018 4. SUDDEN: "Gradual or sudden onset?"     Gradual 5. PATTERN "Does the pain come and go, or is it constant?"    - If constant: "Is it getting better, staying the same, or worsening?"      (Note: Constant means the pain never goes away completely; most serious pain is constant and it progresses)     - If intermittent: "How long does it last?" "Do you have pain now?"     (Note: Intermittent means the pain goes away completely between bouts)     Comes and goes 6. SEVERITY: "How bad is the pain?"  (e.g., Scale 1-10; mild, moderate, or severe)    - MILD (1-3): doesn't interfere with normal activities, abdomen soft and not tender to touch     - MODERATE (4-7): interferes with normal activities or awakens from sleep, abdomen tender to touch     - SEVERE (8-10): excruciating pain, doubled over, unable to do any normal activities       Now - 3 7. RECURRENT SYMPTOM: "Have you ever had this type of stomach pain before?" If Yes, ask: "When was the last time?" and "What happened that time?"      Yes 8. CAUSE: "What do you think is causing the stomach pain?"     Unsure 9.  RELIEVING/AGGRAVATING FACTORS: "What makes it better or worse?" (e.g., movement, antacids, bowel movement)     No 10. OTHER SYMPTOMS: "Do you have any other symptoms?" (e.g., back pain, diarrhea, fever, urination pain, vomiting)       Dizziness  Protocols used: Abdominal Pain - Male-A-AH

## 2022-03-30 ENCOUNTER — Encounter: Payer: Self-pay | Admitting: Physician Assistant

## 2022-03-30 ENCOUNTER — Ambulatory Visit: Payer: BC Managed Care – PPO | Admitting: Physician Assistant

## 2022-03-30 VITALS — BP 132/93 | HR 84 | Temp 98.3°F | Resp 16 | Ht 73.0 in | Wt 194.2 lb

## 2022-03-30 DIAGNOSIS — R030 Elevated blood-pressure reading, without diagnosis of hypertension: Secondary | ICD-10-CM

## 2022-03-30 DIAGNOSIS — R42 Dizziness and giddiness: Secondary | ICD-10-CM | POA: Diagnosis not present

## 2022-03-30 DIAGNOSIS — R350 Frequency of micturition: Secondary | ICD-10-CM

## 2022-03-30 DIAGNOSIS — R1012 Left upper quadrant pain: Secondary | ICD-10-CM | POA: Diagnosis not present

## 2022-03-30 DIAGNOSIS — E039 Hypothyroidism, unspecified: Secondary | ICD-10-CM | POA: Diagnosis not present

## 2022-03-30 DIAGNOSIS — E049 Nontoxic goiter, unspecified: Secondary | ICD-10-CM | POA: Diagnosis not present

## 2022-03-30 NOTE — Progress Notes (Signed)
I,Joseline E Rosas,acting as a Education administrator for [employer], PA-C.,have documented all relevant documentation on the behalf of Mardene Speak, PA-C,as directed by  [employer], PA-C while in the presence of [employer], PA-C.   Established patient visit   Patient: Austin Fletcher   DOB: 15-Aug-1986   36 y.o. Male  MRN: [id number] Visit Date: 03/30/2022  Today's healthcare provider: Mardene Speak, PA-C   Chief Complaint  Patient presents with   Abdominal Pain   Subjective    HPI  Abdominal Pain  He reports chronic abdominal pain x 1 year. The most recent episode started a few weeks ago and is worsening. The abdominal pain is located in the left side and left upper quadrant and does not radiate. It is described as aching, is moderate in intensity, occurring  off and on . It is aggravated by nothing and is relieved by nothing. He has tried antacids and PPIs with no relief. Patient reports that he has been having dizziness for quite some time but now worsening.Reports that he gets dizzy at any time of the day and gets headaches afterwards. Reports that with the abdominal pain, sometimes he urinates more often but no dysuria present. No hematuria.  Associated symptoms: No anorexia  No belching  No bloody stool No blood in urine   No constipation No diarrhea  No dysuria No fever  No flatus No headaches  No headaches No joint pains  No myalgias No nausea  No vomiting No weight loss     Recent GI studies: none  H.pylori-neg in 12/11/2020 Relevant medical history includes: none     Radiation: no Heartburn: yes Jaundice: no Rash: no History of sexually transmitted disease: no Recurrent NSAID use: no, aspirin 81 mg / per personal volition Saw ED on 01/21/21 for chest pain and dizziness, was diagnosed with acid reflux, was Rx Pepcid BID. Dizziness was attributed to dehydration.  Previous labs Lab Results  Component Value Date   WBC 6.4 12/11/2020   HGB 15.0 12/11/2020   HCT 44.4  12/11/2020   MCV 91 12/11/2020   MCH 30.7 12/11/2020   RDW 12.9 12/11/2020   PLT 286 12/11/2020   Lab Results  Component Value Date   GLUCOSE 78 12/11/2020   NA 147 (H) 12/11/2020   K 4.7 12/11/2020   CL 106 12/11/2020   CO2 25 12/11/2020   BUN 14 12/11/2020   CREATININE 1.32 (H) 12/11/2020   GFRNONAA 69 12/11/2020   GFRAA 80 12/11/2020   CALCIUM 9.4 12/11/2020   PROT 7.1 12/11/2020   ALBUMIN 4.6 12/11/2020   LABGLOB 2.5 12/11/2020   AGRATIO 1.8 12/11/2020   BILITOT 0.5 12/11/2020   ALKPHOS 69 12/11/2020   AST 21 12/11/2020   ALT 14 12/11/2020   ANIONGAP 7 01/26/2018   No results found for: AMYLASE -----------------------------------------------------------------------------------------  Patient also concerns of high blood pressure. Can't remember what the numbers were at the store but knows that it was high. Family history of Hypertension.  BP Readings from Last 3 Encounters:  03/30/22 (!) 132/93  11/26/20 (!) 141/93  01/27/18 111/68     Medications: Outpatient Medications Prior to Visit  Medication Sig   aspirin EC 81 MG tablet Take 81 mg by mouth daily. Swallow whole.   No facility-administered medications prior to visit.    Review of Systems  Respiratory: Negative.    Cardiovascular: Negative.   Gastrointestinal:  Positive for abdominal pain.  See hpi    Objective  BP (!) 132/93 (BP Location: Left Arm, Patient Position: Sitting, Cuff Size: Normal)   Pulse 84   Temp 98.3 F (36.8 C) (Oral)   Resp 16   Ht 6\' 1"  (1.854 m)   Wt 194 lb 3.2 oz (88.1 kg)   BMI 25.62 kg/m    Physical Exam Vitals reviewed.  Constitutional:      General: He is not in acute distress.    Appearance: Normal appearance. He is well-developed. He is not ill-appearing, toxic-appearing or diaphoretic.  HENT:     Head: Normocephalic and atraumatic.     Right Ear: Tympanic membrane, ear canal and external ear normal.     Left Ear: Tympanic membrane, ear canal and external  ear normal.     Nose: Congestion present. No rhinorrhea.     Mouth/Throat:     Mouth: Mucous membranes are moist.     Pharynx: Oropharynx is clear. No oropharyngeal exudate.  Eyes:     General: No scleral icterus.    Conjunctiva/sclera: Conjunctivae normal.     Pupils: Pupils are equal, round, and reactive to light.  Cardiovascular:     Rate and Rhythm: Normal rate and regular rhythm.     Pulses: Normal pulses.     Heart sounds: Normal heart sounds. No murmur heard. Pulmonary:     Effort: Pulmonary effort is normal. No respiratory distress.     Breath sounds: Normal breath sounds. No wheezing or rales.  Abdominal:     General: Bowel sounds are normal. There is no distension.     Palpations: Abdomen is soft. There is no mass.     Tenderness: There is no abdominal tenderness. There is no guarding or rebound.     Hernia: No hernia is present.  Musculoskeletal:        General: No swelling, tenderness, deformity or signs of injury. Normal range of motion.     Cervical back: Normal range of motion and neck supple.     Right lower leg: No edema.     Left lower leg: No edema.  Lymphadenopathy:     Cervical: Cervical adenopathy present.  Skin:    General: Skin is warm and dry.     Findings: No rash.  Neurological:     General: No focal deficit present.     Mental Status: He is alert and oriented to person, place, and time. Mental status is at baseline.     Sensory: No sensory deficit.     Motor: No weakness.     Coordination: Coordination normal.     Gait: Gait normal.     Deep Tendon Reflexes: Reflexes normal.  Psychiatric:        Mood and Affect: Mood normal.        Behavior: Behavior normal.        Thought Content: Thought content normal.        Judgment: Judgment normal.     No results found for any visits on 03/30/22.  Assessment & Plan     1. Left upper quadrant abdominal pain Chronic pain x 1 year, with frequency 3x per week Normal abdominal exam during the  visit Taking ASA daily H.Pylori was negative in 2022 - Ambulatory referral to Gastroenterology - Continue Pepcid BID No hx of alcohol use, former smoker  2. Frequent urination - Comprehensive metabolic panel/fasting glucose  3. Dizziness Could be dehydration Initial workup - CBC with Differential/Platelet - Comprehensive metabolic panel - TSH  4. Hypothyroidism (acquired) Was diagnosed in the past -  T4, free - TSH  5. HTN Requested to measure BP at home daily and bring BP records with him to the next appt BP today was 132/93  FU as scheduled     The patient was advised to call back or seek an in-person evaluation if the symptoms worsen or if the condition fails to improve as anticipated.  I discussed the assessment and treatment plan with the patient. The patient was provided an opportunity to ask questions and all were answered. The patient agreed with the plan and demonstrated an understanding of the instructions.  The entirety of the information documented in the History of Present Illness, Review of Systems and Physical Exam were personally obtained by me. Portions of this information were initially documented by the CMA and reviewed by me for thoroughness and accuracy.  Portions of this note were created using dictation software and may contain typographical errors.        Total encounter time more than 30 minutes  Greater than 50% was spent in counseling and coordination of care with the patient   Elberta Leatherwood  [facility] [phone] (phone) [phone] (fax)  [place]

## 2022-03-31 LAB — CBC WITH DIFFERENTIAL/PLATELET
Basophils Absolute: 0.1 10*3/uL (ref 0.0–0.2)
Basos: 1 %
EOS (ABSOLUTE): 0.2 10*3/uL (ref 0.0–0.4)
Eos: 4 %
Hematocrit: 47.4 % (ref 37.5–51.0)
Hemoglobin: 15.9 g/dL (ref 13.0–17.7)
Immature Grans (Abs): 0 10*3/uL (ref 0.0–0.1)
Immature Granulocytes: 0 %
Lymphocytes Absolute: 1.4 10*3/uL (ref 0.7–3.1)
Lymphs: 22 %
MCH: 30.2 pg (ref 26.6–33.0)
MCHC: 33.5 g/dL (ref 31.5–35.7)
MCV: 90 fL (ref 79–97)
Monocytes Absolute: 0.6 10*3/uL (ref 0.1–0.9)
Monocytes: 9 %
Neutrophils Absolute: 4.2 10*3/uL (ref 1.4–7.0)
Neutrophils: 64 %
Platelets: 276 10*3/uL (ref 150–450)
RBC: 5.27 x10E6/uL (ref 4.14–5.80)
RDW: 12.8 % (ref 11.6–15.4)
WBC: 6.5 10*3/uL (ref 3.4–10.8)

## 2022-03-31 LAB — COMPREHENSIVE METABOLIC PANEL
ALT: 23 IU/L (ref 0–44)
AST: 26 IU/L (ref 0–40)
Albumin/Globulin Ratio: 1.5 (ref 1.2–2.2)
Albumin: 4.6 g/dL (ref 4.0–5.0)
Alkaline Phosphatase: 94 IU/L (ref 44–121)
BUN/Creatinine Ratio: 16 (ref 9–20)
BUN: 19 mg/dL (ref 6–20)
Bilirubin Total: 0.5 mg/dL (ref 0.0–1.2)
CO2: 25 mmol/L (ref 20–29)
Calcium: 9.3 mg/dL (ref 8.7–10.2)
Chloride: 100 mmol/L (ref 96–106)
Creatinine, Ser: 1.18 mg/dL (ref 0.76–1.27)
Globulin, Total: 3 g/dL (ref 1.5–4.5)
Glucose: 73 mg/dL (ref 70–99)
Potassium: 4.5 mmol/L (ref 3.5–5.2)
Sodium: 139 mmol/L (ref 134–144)
Total Protein: 7.6 g/dL (ref 6.0–8.5)
eGFR: 82 mL/min/{1.73_m2} (ref >59)

## 2022-03-31 LAB — TSH: TSH: 3.33 u[IU]/mL (ref 0.450–4.500)

## 2022-03-31 LAB — T4, FREE: Free T4: 1.07 ng/dL (ref 0.82–1.77)

## 2022-04-15 ENCOUNTER — Encounter: Payer: Self-pay | Admitting: Physician Assistant

## 2022-04-15 ENCOUNTER — Ambulatory Visit: Payer: BC Managed Care – PPO | Admitting: Physician Assistant

## 2022-04-15 VITALS — BP 133/95 | HR 98 | Temp 97.9°F | Resp 16 | Wt 193.5 lb

## 2022-04-15 DIAGNOSIS — L237 Allergic contact dermatitis due to plants, except food: Secondary | ICD-10-CM

## 2022-04-15 MED ORDER — FLUOCINONIDE EMULSIFIED BASE 0.05 % EX CREA: 1.0000 "application " | TOPICAL_CREAM | Freq: Two times a day (BID) | CUTANEOUS | 3 refills | Status: AC

## 2022-04-15 MED ORDER — METHYLPREDNISOLONE ACETATE 40 MG/ML IJ SUSP
40.0000 mg | Freq: Once | INTRAMUSCULAR | Status: AC
  Administered 2022-04-15: 40 mg via INTRAMUSCULAR

## 2022-04-15 MED ORDER — PREDNISONE 10 MG PO TABS: 10.0000 mg | ORAL_TABLET | Freq: Every day | ORAL | 0 refills | Status: AC

## 2022-04-15 MED ORDER — CETIRIZINE HCL 10 MG PO TABS: 10.0000 mg | ORAL_TABLET | Freq: Every day | ORAL | 11 refills | Status: AC

## 2022-04-15 NOTE — Progress Notes (Unsigned)
I,Austin Fletcher,acting as a Education administrator for [employer], PA-C.,have documented all relevant documentation on the behalf of Austin Speak, PA-C,as directed by  [employer], PA-C while in the presence of [employer], PA-C.   Established patient visit   Patient: Austin Fletcher   DOB: 12/02/1985   36 y.o. Male  MRN: [id number] Visit Date: 04/15/2022  Today's healthcare provider: Mardene Speak, PA-C   Chief Complaint  Patient presents with   Rash  CC: itching / rash  Subjective    Austin Fletcher is a 37 yr old male presenting for rash/red, itchy with blisters.  Trimmed a rose bush on Saturday with shirt off.  Rash started Monday on stomach and started spreading.  Treated with OTC poison ivy spray and oatmeal bath with no relief.    Medications: Outpatient Medications Prior to Visit  Medication Sig   aspirin EC 81 MG tablet Take 81 mg by mouth daily. Swallow whole.   No facility-administered medications prior to visit.    Review of Systems  Constitutional:  Positive for activity change.  Skin:  Positive for rash.   See HPI     Objective    BP (!) 133/95 (BP Location: Right Arm, Patient Position: Sitting, Cuff Size: Normal)   Pulse 98   Temp 97.9 F (36.6 C) (Oral)   Resp 16   Wt 193 lb 8 oz (87.8 kg)   SpO2 100%   BMI 25.53 kg/m    Physical Exam Vitals reviewed.  Constitutional:      General: He is in acute distress.     Appearance: Normal appearance.  HENT:     Head: Normocephalic and atraumatic.  Skin:    General: Skin is warm.     Findings: Rash (pruritic, all over body) present.     Comments: Papules, vesicles, bullae with surrounding erythema Pruritus  Neurological:     General: No focal deficit present.     Mental Status: He is alert and oriented to person, place, and time.  Psychiatric:        Behavior: Behavior normal.        Thought Content: Thought content normal.        Judgment: Judgment normal.    No results found for any visits on  04/15/22.  Assessment & Plan     1. Allergic contact dermatitis due to plants, except food Pruritis - predniSONE (DELTASONE) 10 MG tablet; Take 1 tablet (10 mg total) by mouth daily with breakfast.  Dispense: 42 tablet; Refill: 0 - cetirizine (ZYRTEC) 10 MG tablet; Take 1 tablet (10 mg total) by mouth daily.  Dispense: 30 tablet; Refill: 11 - fluocinonide-emollient (LIDEX-E) 0.05 % cream; Apply 1 application  topically 2 (two) times daily.  Dispense: 30 g; Refill: 3 - methylPREDNISolone acetate (DEPO-MEDROL) injection 40 mg Continue oatmeal bath and soaks with cool tap water, use of emollients, avoidance of offending agent Benadryl  Fu as scheduled.  The patient was advised to call back or seek an in-person evaluation if the symptoms worsen or if the condition fails to improve as anticipated.  I discussed the assessment and treatment plan with the patient. The patient was provided an opportunity to ask questions and all were answered. The patient agreed with the plan and demonstrated an understanding of the instructions.  The entirety of the information documented in the History of Present Illness, Review of Systems and Physical Exam were personally obtained by me. Portions of this information were initially documented by the [facility]  and reviewed by me for thoroughness and accuracy.  Portions of this note were created using dictation software and may contain typographical errors.    Austin Speak, PA-C  [facility] [phone] (phone) [phone] (fax)  [place]

## 2022-04-21 DIAGNOSIS — L237 Allergic contact dermatitis due to plants, except food: Secondary | ICD-10-CM | POA: Diagnosis not present

## 2022-04-21 DIAGNOSIS — L089 Local infection of the skin and subcutaneous tissue, unspecified: Secondary | ICD-10-CM | POA: Diagnosis not present

## 2022-04-21 DIAGNOSIS — S30811A Abrasion of abdominal wall, initial encounter: Secondary | ICD-10-CM | POA: Diagnosis not present

## 2022-05-04 ENCOUNTER — Encounter: Payer: BC Managed Care – PPO | Admitting: Physician Assistant

## 2022-05-04 DIAGNOSIS — Z0000 Encounter for general adult medical examination without abnormal findings: Secondary | ICD-10-CM

## 2022-05-04 NOTE — Progress Notes (Deleted)
Complete physical exam   Patient: Austin Fletcher   DOB: 08/27/1986   36 y.o. Male  MRN: [id number] Visit Date: 05/04/2022  Today's healthcare provider: Debera Lat, PA-C   No chief complaint on file.  Subjective    Austin Fletcher is a 36 y.o. male who presents today for a complete physical exam.  He reports consuming a {diet types:17450} diet. {Exercise:19826} He generally feels {well/fairly well/poorly:18703}. He reports sleeping {well/fairly well/poorly:18703}. He {does/does not:200015} have additional problems to discuss today.  HPI  ***  Past Medical History:  Diagnosis Date   Anxiety    Hypothyroid    Past Surgical History:  Procedure Laterality Date   LEFT HEART CATH AND CORONARY ANGIOGRAPHY Left 12/20/2017   Procedure: LEFT HEART CATH AND CORONARY ANGIOGRAPHY;  Surgeon: Alwyn Pea, MD;  Location: [facility] INVASIVE CV LAB;  Service: Cardiovascular;  Laterality: Left;   Social History   Socioeconomic History   Marital status: Legally Separated    Spouse name: Not on file   Number of children: Not on file   Years of education: Not on file   Highest education level: Not on file  Occupational History   Not on file  Tobacco Use   Smoking status: Former    Packs/day: 1.00    Types: Cigarettes   Smokeless tobacco: Never   Tobacco comments:    july 2018  Vaping Use   Vaping Use: Never used  Substance and Sexual Activity   Alcohol use: No   Drug use: No   Sexual activity: Yes  Other Topics Concern   Not on file  Social History Narrative   Not on file   Social Determinants of Health   Financial Resource Strain: Not on file  Food Insecurity: Not on file  Transportation Needs: Not on file  Physical Activity: Not on file  Stress: Not on file  Social Connections: Not on file  Intimate Partner Violence: Not on file   Family Status  Relation Name Status   Mother  (Not Specified)   Father  (Not Specified)   MGM  (Not Specified)   PGM  (Not  Specified)   Sib  Other   Family History  Problem Relation Age of Onset   Hernia Mother    Hypertension Father    Heart attack Maternal Grandmother    Stroke Paternal Grandmother    Heart attack Sibling    No Known Allergies  Patient Care Team: Debera Lat, PA-C as PCP - General (Physician Assistant)   Medications: Outpatient Medications Prior to Visit  Medication Sig   aspirin EC 81 MG tablet Take 81 mg by mouth daily. Swallow whole.   cetirizine (ZYRTEC) 10 MG tablet Take 1 tablet (10 mg total) by mouth daily.   fluocinonide-emollient (LIDEX-E) 0.05 % cream Apply 1 application  topically 2 (two) times daily.   predniSONE (DELTASONE) 10 MG tablet Take 1 tablet (10 mg total) by mouth daily with breakfast.   No facility-administered medications prior to visit.    Review of Systems  {Labs  Heme  Chem  Endocrine  Serology  Results Review (optional):23779}  Objective    There were no vitals taken for this visit. {Show previous vital signs (optional):23777}   Physical Exam  ***  Last depression screening scores    03/30/2022    3:13 PM 11/26/2020    3:17 PM  PHQ 2/9 Scores  PHQ - 2 Score 0 1  PHQ- 9 Score  3  Last fall risk screening    03/30/2022    3:13 PM  Fall Risk   Falls in the past year? 0  Number falls in past yr: 0  Injury with Fall? 0   Last Audit-C alcohol use screening    03/30/2022    3:14 PM  Alcohol Use Disorder Test (AUDIT)  1. How often do you have a drink containing alcohol? 0  2. How many drinks containing alcohol do you have on a typical day when you are drinking? 0  3. How often do you have six or more drinks on one occasion? 0  AUDIT-C Score 0   A score of 3 or more in women, and 4 or more in men indicates increased risk for alcohol abuse, EXCEPT if all of the points are from question 1   No results found for any visits on 05/04/22.  Assessment & Plan    Routine Health Maintenance and Physical Exam  Exercise Activities and  Dietary recommendations  Goals   None      There is no immunization history on file for this patient.  Health Maintenance  Topic Date Due   COVID-19 Vaccine (1) Never done   HIV Screening  Never done   Hepatitis C Screening  Never done   INFLUENZA VACCINE  06/07/2022   TETANUS/TDAP  01/09/2028   HPV VACCINES  Aged Out    Discussed health benefits of physical activity, and encouraged him to engage in regular exercise appropriate for his age and condition.  ***  No follow-ups on file.     {provider attestation***:1}   Debera Lat, Cordelia Poche  [facility] [phone] (phone) [phone] (fax)  [facility]

## 2022-07-06 DIAGNOSIS — R1010 Upper abdominal pain, unspecified: Secondary | ICD-10-CM | POA: Diagnosis not present

## 2022-07-06 DIAGNOSIS — K219 Gastro-esophageal reflux disease without esophagitis: Secondary | ICD-10-CM | POA: Diagnosis not present

## 2022-07-25 ENCOUNTER — Emergency Department
Admission: EM | Admit: 2022-07-25 | Discharge: 2022-07-25 | Disposition: A | Discharge status: Home / Self Care | Payer: BC Managed Care – PPO | Attending: Emergency Medicine | Admitting: Emergency Medicine

## 2022-07-25 ENCOUNTER — Emergency Department: Payer: BC Managed Care – PPO

## 2022-07-25 ENCOUNTER — Encounter: Payer: Self-pay | Admitting: Emergency Medicine

## 2022-07-25 DIAGNOSIS — R0789 Other chest pain: Secondary | ICD-10-CM | POA: Insufficient documentation

## 2022-07-25 DIAGNOSIS — R079 Chest pain, unspecified: Secondary | ICD-10-CM | POA: Diagnosis not present

## 2022-07-25 LAB — BASIC METABOLIC PANEL
Anion gap: 7 (ref 5–15)
BUN: 12 mg/dL (ref 6–20)
CO2: 26 mmol/L (ref 22–32)
Calcium: 9 mg/dL (ref 8.9–10.3)
Chloride: 108 mmol/L (ref 98–111)
Creatinine, Ser: 1.14 mg/dL (ref 0.61–1.24)
GFR, Estimated: >60 mL/min (ref >60)
Glucose, Bld: 114 mg/dL — ABNORMAL HIGH (ref 70–99)
Potassium: 3.8 mmol/L (ref 3.5–5.1)
Sodium: 141 mmol/L (ref 135–145)

## 2022-07-25 LAB — CBC
HCT: 43.7 % (ref 39.0–52.0)
Hemoglobin: 14.3 g/dL (ref 13.0–17.0)
MCH: 30.1 pg (ref 26.0–34.0)
MCHC: 32.7 g/dL (ref 30.0–36.0)
MCV: 92 fL (ref 80.0–100.0)
Platelets: 285 10*3/uL (ref 150–400)
RBC: 4.75 MIL/uL (ref 4.22–5.81)
RDW: 13.1 % (ref 11.5–15.5)
WBC: 6 10*3/uL (ref 4.0–10.5)
nRBC: 0 % (ref 0.0–0.2)

## 2022-07-25 LAB — TROPONIN I (HIGH SENSITIVITY): Troponin I (High Sensitivity): 4 ng/L (ref <18)

## 2022-07-25 MED ORDER — MELOXICAM 7.5 MG PO TABS
15.0000 mg | ORAL_TABLET | Freq: Once | ORAL | Status: AC
  Administered 2022-07-25: 15 mg via ORAL
  Filled 2022-07-25: qty 2

## 2022-07-25 MED ORDER — METHOCARBAMOL 500 MG PO TABS: 500.0000 mg | ORAL_TABLET | Freq: Four times a day (QID) | ORAL | 0 refills | Status: AC

## 2022-07-25 MED ORDER — MELOXICAM 15 MG PO TABS: 15.0000 mg | ORAL_TABLET | Freq: Every day | ORAL | 0 refills | Status: AC

## 2022-07-25 NOTE — ED Notes (Signed)
Dc instructions and scripts reviewed with pt no questions or concerns at this time will follow up with cardiology.

## 2022-07-25 NOTE — ED Provider Notes (Signed)
[facility] Provider Note  Patient Contact: 10:12 PM (approximate)   History   Chest Pain   HPI  Austin Fletcher is a 36 y.o. male who presented to the emergency department complaining of left-sided rib/chest pain.  Patient has had symptoms off and on for few months, worsening over the past couple of days.  No trauma to the chest.  Patient has a familial history of cardiac problems but no personal history.  Patient had chest pain few years ago that was evaluated by cardiology with nuclear medicine, echo and stress test.  Patient had no coronary artery disease, heart wall defects or valvular issues.  EF was normal.  Patient has no associated symptoms of fevers, chills, URI symptoms, shortness of breath, cough.  No pleuritic pain.  No GI symptoms.  Pain is reproduced with palpation of his last chest     Physical Exam   Triage Vital Signs: ED Triage Vitals  Enc Vitals Group     BP 07/25/22 1904 (!) 143/90     Pulse Rate 07/25/22 1904 72     Resp 07/25/22 1904 18     Temp 07/25/22 1904 99.3 F (37.4 C)     Temp Source 07/25/22 1904 Oral     SpO2 07/25/22 1904 97 %     Weight 07/25/22 1902 194 lb 0.1 oz (88 kg)     Height 07/25/22 1902 6\' 1"  (1.854 m)     Head Circumference --      Peak Flow --      Pain Score 07/25/22 1902 9     Pain Loc --      Pain Edu? --      Excl. in GC? --     Most recent vital signs: Vitals:   07/25/22 1904  BP: (!) 143/90  Pulse: 72  Resp: 18  Temp: 99.3 F (37.4 C)  SpO2: 97%     General: Alert and in no acute distress.  Cardiovascular:  Good peripheral perfusion.  Normal S1 and S2 with no murmurs, rubs, gallops Respiratory: Normal respiratory effort without tachypnea or retractions. Lungs CTAB. Good air entry to the bases with no decreased or absent breath sounds. Gastrointestinal: Bowel sounds 4 quadrants. Soft and nontender to palpation. No guarding or rigidity. No palpable masses. No distention. No CVA  tenderness Musculoskeletal: Full range of motion to all extremities.  No visible signs of trauma to the left ribs.  Palpation of the intercostal margins on the left anterolateral rib cage reveals tenderness and reproducible symptoms Neurologic:  No gross focal neurologic deficits are appreciated.  Skin:   No rash noted Other:   ED Results / Procedures / Treatments   Labs (all labs ordered are listed, but only abnormal results are displayed) Labs Reviewed  BASIC METABOLIC PANEL - Abnormal; Notable for the following components:      Result Value   Glucose, Bld 114 (*)    All other components within normal limits  CBC  TROPONIN I (HIGH SENSITIVITY)  TROPONIN I (HIGH SENSITIVITY)     EKG  ED ECG REPORT I, 07/27/22 Oriel Rumbold,  personally viewed and interpreted this ECG.   Date: 07/25/2022  EKG Time: 1906 hrs.  Rate: 64 bpm  Rhythm: unchanged from previous tracings, normal sinus rhythm, sinus arrhythmia.  Largely unchanged from previous EKG from 01/27/2018  Axis: Normal axis  Intervals:none  ST&T Change: No ST elevation or depression  Normal sinus rhythm.  No STEMI.    RADIOLOGY  I personally viewed, evaluated, and interpreted these images as part of my medical decision making, as well as reviewing the written report by the radiologist.  ED Provider Interpretation: No acute cardiopulmonary findings on chest x-ray.  DG Chest 2 View  Result Date: 07/25/2022 CLINICAL DATA:  Chest pain EXAM: CHEST - 2 VIEW COMPARISON:  Previous studies including the examination of 01/26/2018 FINDINGS: The heart size and mediastinal contours are within normal limits. Both lungs are clear. The visualized skeletal structures are unremarkable. IMPRESSION: No active cardiopulmonary disease. Electronically Signed   By: Elmer Picker M.D.   On: 07/25/2022 19:31    PROCEDURES:  Critical Care performed: No  Procedures   MEDICATIONS ORDERED IN ED: Medications  meloxicam (MOBIC) tablet 15  mg (has no administration in time range)     IMPRESSION / MDM / ASSESSMENT AND PLAN / ED COURSE  I reviewed the triage vital signs and the nursing notes.                              Differential diagnosis includes, but is not limited to, STEMI, NSTEMI, chest wall pain, rib fracture, pneumonia, bronchitis, pneumothorax  Patient's presentation is most consistent with acute presentation with potential threat to life or bodily function.   Patient's diagnosis is consistent with nonspecific chest pain, costochondritis.  Patient presents to the ED with intermittent chest pain times several months.  Patient was having some rib pain for the last couple of days.  Reproducible on exam today.  Work-up including troponin, EKG, chest x-ray and labs are reassuring.  Given the reproducible nature I suspect that this is musculoskeletal.  Patient has had off-and-on chest pain on review of medical record for years.  He has been seen by cardiology in the past with nuclear medicine study, echo and stress test.  Results of cardiac testing were reassuring.  Again I do not suspect cardiac source of patient's chest pain today.  I recommend follow-up with cardiologist if symptoms are ongoing.  I will treat the costochondral pain with anti-inflammatory muscle relaxer..  Again follow-up with cardiologist as needed.  Patient is given ED precautions to return to the ED for any worsening or new symptoms.        FINAL CLINICAL IMPRESSION(S) / ED DIAGNOSES   Final diagnoses:  Chest wall pain     Rx / DC Orders   ED Discharge Orders          Ordered    meloxicam (MOBIC) 15 MG tablet  Daily        07/25/22 2218    methocarbamol (ROBAXIN) 500 MG tablet  4 times daily        07/25/22 2218             Note:  This document was prepared using Dragon voice recognition software and may include unintentional dictation errors.   Brynda Peon 07/25/22 2222    Carrie Mew, MD 07/25/22  2246

## 2022-07-25 NOTE — ED Triage Notes (Signed)
Pt presents via POV with complaints of left sided rib/chest pain that has been going on intermittently for the last several months. Pt took 2 ASA PTA without any improvement in the pain. He states that he has a burning sensation that goes from his ribs down to his left left - no difficulty with ambulation. Rates the pain a 9/10. Denies fevers or SOB.

## 2022-08-12 DIAGNOSIS — R1010 Upper abdominal pain, unspecified: Secondary | ICD-10-CM | POA: Diagnosis not present

## 2022-08-12 DIAGNOSIS — K222 Esophageal obstruction: Secondary | ICD-10-CM | POA: Diagnosis not present

## 2022-08-12 DIAGNOSIS — K3189 Other diseases of stomach and duodenum: Secondary | ICD-10-CM | POA: Diagnosis not present

## 2022-08-12 DIAGNOSIS — K2289 Other specified disease of esophagus: Secondary | ICD-10-CM | POA: Diagnosis not present

## 2022-08-12 DIAGNOSIS — R079 Chest pain, unspecified: Secondary | ICD-10-CM | POA: Diagnosis not present

## 2022-08-12 DIAGNOSIS — K219 Gastro-esophageal reflux disease without esophagitis: Secondary | ICD-10-CM | POA: Diagnosis not present

## 2022-08-12 DIAGNOSIS — R1310 Dysphagia, unspecified: Secondary | ICD-10-CM | POA: Diagnosis not present

## 2022-08-12 DIAGNOSIS — K2970 Gastritis, unspecified, without bleeding: Secondary | ICD-10-CM | POA: Diagnosis not present

## 2022-08-12 DIAGNOSIS — K449 Diaphragmatic hernia without obstruction or gangrene: Secondary | ICD-10-CM | POA: Diagnosis not present

## 2024-03-28 ENCOUNTER — Encounter: Payer: Self-pay | Admitting: Physician Assistant

## 2024-03-28 ENCOUNTER — Ambulatory Visit (INDEPENDENT_AMBULATORY_CARE_PROVIDER_SITE_OTHER): Admitting: Physician Assistant

## 2024-03-28 VITALS — BP 117/80 | HR 59 | Resp 16 | Ht 72.0 in | Wt 175.0 lb

## 2024-03-28 DIAGNOSIS — R1013 Epigastric pain: Secondary | ICD-10-CM

## 2024-03-28 DIAGNOSIS — E079 Disorder of thyroid, unspecified: Secondary | ICD-10-CM

## 2024-03-28 DIAGNOSIS — R0789 Other chest pain: Secondary | ICD-10-CM | POA: Diagnosis not present

## 2024-03-28 DIAGNOSIS — Z0000 Encounter for general adult medical examination without abnormal findings: Secondary | ICD-10-CM

## 2024-03-28 DIAGNOSIS — J3089 Other allergic rhinitis: Secondary | ICD-10-CM | POA: Diagnosis not present

## 2024-03-28 DIAGNOSIS — Z0001 Encounter for general adult medical examination with abnormal findings: Secondary | ICD-10-CM | POA: Diagnosis not present

## 2024-03-28 DIAGNOSIS — F17290 Nicotine dependence, other tobacco product, uncomplicated: Secondary | ICD-10-CM

## 2024-03-28 DIAGNOSIS — R1012 Left upper quadrant pain: Secondary | ICD-10-CM

## 2024-03-28 NOTE — Progress Notes (Signed)
 Established patient visit  Patient: Austin Fletcher   DOB: 1985/12/31   37 y.o. Male  MRN: [id number] Visit Date: 03/28/2024  Today's healthcare provider: Blane Bunting, PA-C   Chief Complaint  Patient presents with   Annual Exam    CPE chest pains ,headaches , and dizziness   Subjective     HPI     Annual Exam    Additional comments: CPE chest pains ,headaches , and dizziness      Last edited by Austin Fletcher, Austin Fletcher on 03/28/2024  1:45 PM.       Discussed the use of AI scribe software for clinical note transcription with the patient, who gave verbal consent to proceed.  History of Present Illness Austin Fletcher is a 38 year old male who presents with chronic chest pain.  He experiences chest pain periodically, without a specific trigger, located in the chest and sometimes radiating to the upper left abdomen, accompanied by left-sided headaches. The pain occurs a couple of times a week, lasting about 30 minutes, with no alleviating or exacerbating factors identified. He has a history of frequent ER visits for similar pain and underwent a heart catheterization in 2019, though specific findings are not recalled. He is not on cardiac medications and uses Tylenol  for pain management.  He quit smoking cigarettes a year ago after a 15-year history and currently vapes. He consumes alcohol infrequently, about once or twice a month. A past thyroid  issue was identified during previous ER visits, though details are unclear.  He experiences occasional shortness of breath and dizziness, with knee weakness when dizzy. He has allergies with nasal congestion and drainage, managed with Flonase. Occasional stomach pain and headaches are noted. No issues with bowel movements or nausea and vomiting. No specific triggers for his chest pain, such as exercise or rest.       03/30/2022    3:13 PM 11/26/2020    3:17 PM  Depression screen PHQ 2/9  Decreased Interest 0 0  Down, Depressed, Hopeless 0  1  PHQ - 2 Score 0 1  Altered sleeping  0  Tired, decreased energy  1  Change in appetite  0  Feeling bad or failure about yourself   1  Trouble concentrating  0  Moving slowly or fidgety/restless  0  Suicidal thoughts  0  PHQ-9 Score  3  Difficult doing work/chores  Not difficult at all       No data to display          Medications: Outpatient Medications Prior to Visit  Medication Sig   aspirin  EC 81 MG tablet Take 81 mg by mouth daily. Swallow whole.   cetirizine  (ZYRTEC ) 10 MG tablet Take 1 tablet (10 mg total) by mouth daily. (Patient not taking: Reported on 03/28/2024)   fluocinonide -emollient (LIDEX -E) 0.05 % cream Apply 1 application  topically 2 (two) times daily. (Patient not taking: Reported on 03/28/2024)   methocarbamol  (ROBAXIN ) 500 MG tablet Take 1 tablet (500 mg total) by mouth 4 (four) times daily. (Patient not taking: Reported on 03/28/2024)   predniSONE  (DELTASONE ) 10 MG tablet Take 1 tablet (10 mg total) by mouth daily with breakfast. (Patient not taking: Reported on 03/28/2024)   No facility-administered medications prior to visit.    Review of Systems All negative Except see HPI       Objective    BP 117/80 (BP Location: Right Arm, Patient Position: Sitting, Cuff Size: Large)   Pulse (!) 59   Resp  16   Ht 6' (1.829 m)   Wt 175 lb (79.4 kg)   SpO2 100%   BMI 23.73 kg/m     Physical Exam Vitals reviewed.  Constitutional:      General: He is not in acute distress.    Appearance: Normal appearance. He is well-developed. He is not ill-appearing, toxic-appearing or diaphoretic.  HENT:     Head: Normocephalic and atraumatic.     Right Ear: Tympanic membrane, ear canal and external ear normal.     Left Ear: Tympanic membrane, ear canal and external ear normal.     Nose: Nose normal. No congestion or rhinorrhea.     Mouth/Throat:     Mouth: Mucous membranes are moist.     Pharynx: Oropharynx is clear. No oropharyngeal exudate.  Eyes:      General: No scleral icterus.       Right eye: No discharge.        Left eye: No discharge.     Conjunctiva/sclera: Conjunctivae normal.     Pupils: Pupils are equal, round, and reactive to light.  Neck:     Thyroid : No thyromegaly.     Vascular: No carotid bruit.  Cardiovascular:     Rate and Rhythm: Normal rate and regular rhythm.     Pulses: Normal pulses.     Heart sounds: Normal heart sounds. No murmur heard.    No friction rub. No gallop.  Pulmonary:     Effort: Pulmonary effort is normal. No respiratory distress.     Breath sounds: Normal breath sounds. No wheezing or rales.  Abdominal:     General: Abdomen is flat. Bowel sounds are normal. There is no distension.     Palpations: Abdomen is soft. There is no mass.     Tenderness: There is no abdominal tenderness. There is no right CVA tenderness, left CVA tenderness, guarding or rebound.     Hernia: No hernia is present.  Musculoskeletal:        General: No swelling, tenderness, deformity or signs of injury. Normal range of motion.     Cervical back: Normal range of motion and neck supple. No rigidity or tenderness.     Right lower leg: No edema.     Left lower leg: No edema.  Lymphadenopathy:     Cervical: No cervical adenopathy.  Skin:    General: Skin is warm and dry.     Coloration: Skin is not jaundiced or pale.     Findings: No bruising, erythema, lesion or rash.  Neurological:     Mental Status: He is alert and oriented to person, place, and time. Mental status is at baseline.     Gait: Gait normal.  Psychiatric:        Mood and Affect: Mood normal.        Behavior: Behavior normal.        Thought Content: Thought content normal.        Judgment: Judgment normal.      No results found for any visits on 03/28/24.       Assessment & Plan Chest pain Intermittent chest pain with no specific triggers.  Normal vitals except HR 59 , differential includes cardiac, musculoskeletal, and gastrointestinal causes.  Previous heart evaluation unremarkable. Possible thyroid  disorder connection. Denies shortness of breath, palpitation - Order cardiac markers. - Order chest x-ray. - Order blood work including thyroid  function tests. Consider EKG if symptoms persist Consider a referral to cardiology Will follow-up  Thyroid  disorder Uncertain  thyroid  disorder type, possibly linked to chest pain and weakness. Previous thyroid  issues noted. - Order thyroid  ultrasound. - Include thyroid  function tests in blood work. Will follow-up  Left upper abdominal pain Epigastric pain Intermittent Has been taking Tylenol  with some relief Denies having nausea, vomiting Patient with H. pylori and lipase Will follow-up  Nicotine dependence, vaping Former cigarette smoker, now using nicotine vape. Vaping equated to smoking in health risks. Cessation advised Will follow-up  Allergic rhinitis Could be seasonal  nasal congestion and drainage likely due to allergies. Inconsistent use of Flonase and saline rinse. - Recommend nasal saline rinse before using Flonase. - Advise use of antihistamines such as Allegra, Claritin, or Zyrtec . - Encourage increased water intake.  Annual physical exam Well adult visit with abnormal findings, see above chest UTD with eye exam, was done 3 month ago. Things to do to keep yourself healthy  - Exercise at least 30-45 minutes a day, 3-4 days a week.  - Eat a low-fat diet with lots of fruits and vegetables, up to 7-9 servings per day.  - Seatbelts can save your life. Wear them always.  - Smoke detectors on every level of your home, check batteries every year.  - Eye Doctor - have an eye exam every 1-2 years  - Safe sex - if you may be exposed to STDs, use a condom.  - Alcohol -  If you drink, do it moderately, less than 2 drinks per day.  - Health Care Power of Attorney. Choose someone to speak for you if you are not able.  - Depression is common in our stressful world.If you're  feeling down or losing interest in things you normally enjoy, please come in for a visit.  - Violence - If anyone is threatening or hurting you, please call immediately.   No orders of the defined types were placed in this encounter.   No follow-ups on file.   The patient was advised to call back or seek an in-person evaluation if the symptoms worsen or if the condition fails to improve as anticipated.  I discussed the assessment and treatment plan with the patient. The patient was provided an opportunity to ask questions and all were answered. The patient agreed with the plan and demonstrated an understanding of the instructions.  I, Bon Dowis, PA-C have reviewed all documentation for this visit. The documentation on 03/28/2024  for the exam, diagnosis, procedures, and orders are all accurate and complete.  Austin Fletcher, [facility], MMS [facility] [phone] (phone) [phone] (fax)  [facility]

## 2024-03-29 DIAGNOSIS — R1013 Epigastric pain: Secondary | ICD-10-CM | POA: Insufficient documentation

## 2024-03-29 DIAGNOSIS — F17290 Nicotine dependence, other tobacco product, uncomplicated: Secondary | ICD-10-CM | POA: Insufficient documentation

## 2024-03-29 DIAGNOSIS — R1012 Left upper quadrant pain: Secondary | ICD-10-CM | POA: Insufficient documentation

## 2024-03-29 DIAGNOSIS — J309 Allergic rhinitis, unspecified: Secondary | ICD-10-CM | POA: Insufficient documentation

## 2024-03-29 DIAGNOSIS — Z0000 Encounter for general adult medical examination without abnormal findings: Secondary | ICD-10-CM | POA: Insufficient documentation

## 2024-03-29 DIAGNOSIS — R0789 Other chest pain: Secondary | ICD-10-CM | POA: Insufficient documentation

## 2024-03-29 DIAGNOSIS — E079 Disorder of thyroid, unspecified: Secondary | ICD-10-CM | POA: Insufficient documentation

## 2024-04-09 ENCOUNTER — Ambulatory Visit

## 2024-04-12 NOTE — Addendum Note (Signed)
 Addended by: Rachel Rison on: 04/12/2024 11:43 AM   Modules accepted: Orders

## 2024-04-15 ENCOUNTER — Encounter: Admitting: Physician Assistant

## 2024-04-17 ENCOUNTER — Ambulatory Visit: Attending: Physician Assistant

## 2024-04-30 ENCOUNTER — Ambulatory Visit: Admitting: Physician Assistant

## 2024-05-22 ENCOUNTER — Ambulatory Visit: Payer: Self-pay | Admitting: Family Medicine

## 2024-05-22 ENCOUNTER — Encounter: Payer: Self-pay | Admitting: Family Medicine

## 2024-05-22 DIAGNOSIS — Z113 Encounter for screening for infections with a predominantly sexual mode of transmission: Secondary | ICD-10-CM

## 2024-05-22 LAB — HM HIV SCREENING LAB: HM HIV Screening: NEGATIVE

## 2024-05-22 NOTE — Progress Notes (Signed)
 [facility] Department STI clinic [address] [place] [address] Main phone: [phone]  STI screening visit  Subjective:  Austin Fletcher is a 38 y.o. male being seen today for an STI screening visit. The patient reports they do not have symptoms.    Patient has the following medical conditions:  Patient Active Problem List   Diagnosis Date Noted   Annual physical exam 03/29/2024   Other chest pain 03/29/2024   Allergic rhinitis due to allergen 03/29/2024   Vaping nicotine dependence, tobacco product 03/29/2024   Thyroid  disorder 03/29/2024   LUQ abdominal pain 03/29/2024   Epigastric pain 03/29/2024   Chief Complaint  Patient presents with   SEXUALLY TRANSMITTED DISEASE   HPI Patient reports for asymptomatic STI testing. No contacts.  See flowsheet for further details and programmatic requirements  Hyperlink available at the top of the signed note in blue.  Flow sheet content below:  Pregnancy Intention Screening Does the patient want to become pregnant in the next year?: Ok Either Way Does the patient's partner want to become pregnant in the next year?: Ok Either Way Would the patient like to discuss contraceptive options today?: N/A Reason For STD Screen STD Screening: Is asymptomatic History of Antibiotic use in the past 2 weeks?: No STD Symptoms Denies all: Yes Risk Factors for Hep B Sexual contact with a person who uses drugs not as prescribed?: No Currently or Ever used drugs not as prescribed: No HIV Positive: No PRep Patient: No Men who have sex with men: No Have Hepatitis C: No History of Incarceration: No History of Homeslessness?: No Anal sex following anal drug use?: No Risk Factors for Hep C Currently using drugs not as prescribed: No Sexual partner(s) currently using drugs as not prescribed: No History of drug use: No HIV Positive: No People with a history of incarceration: No People born between the  years of 85 and 75: No Advise Advised client to quit or stay quit. : Yes Abuse History Has patient ever been abused physically?: No Has patient ever been abused sexually?: No Does patient feel they have a problem with Anxiety?: No Does patient feel they have a problem with Depression?: No Counseling Patient counseled to use condoms with all sex: Condoms declined RTC in 2-3 weeks for test results: Yes Clinic will call if test results abnormal before test result appt.: Yes Test results given to patient Patient counseled to use condoms with all sex: Condoms declined  Screening for MPX risk: Does the patient have an unexplained rash? No Is the patient MSM? No Does the patient endorse multiple sex partners or anonymous sex partners? No Did the patient have close or sexual contact with a person diagnosed with MPX? No Has the patient traveled outside the [place]  where MPX is endemic? No Is there a high clinical suspicion for MPX-- evidenced by one of the following No  -Unlikely to be chickenpox  -Lymphadenopathy  -Rash that present in same phase of evolution on any given body part  STI screening history: Last HIV test per patient/review of record was No results found for: HMHIVSCREEN No results found for: HIV  Last HEPC test per patient/review of record was No results found for: HMHEPCSCREEN No components found for: HEPC   Last HEPB test per patient/review of record was No components found for: HMHEPBSCREEN   Fertility: Does the patient or their partner desires a pregnancy in the next year? No  Immunization History  Administered Date(s) Administered  Tdap 01/08/2018    The following portions of the patient's history were reviewed and updated as appropriate: allergies, current medications, past medical history, past social history, past surgical history and problem list.  Objective:  There were no vitals filed for this visit.  Physical Exam Vitals and nursing note  reviewed.  Constitutional:      Appearance: Normal appearance.  HENT:     Head: Normocephalic and atraumatic.     Mouth/Throat:     Mouth: Mucous membranes are moist.     Pharynx: No oropharyngeal exudate or posterior oropharyngeal erythema.  Eyes:     General:        Right eye: No discharge.        Left eye: No discharge.     Conjunctiva/sclera:     Right eye: Right conjunctiva is not injected. No exudate.    Left eye: Left conjunctiva is not injected. No exudate. Pulmonary:     Effort: Pulmonary effort is normal.  Abdominal:     General: Abdomen is flat.  Genitourinary:    Comments: Declined genital exam- asymptomatic Lymphadenopathy:     Cervical: No cervical adenopathy.     Upper Body:     Right upper body: No supraclavicular adenopathy.     Left upper body: No supraclavicular adenopathy.  Skin:    General: Skin is warm and dry.  Neurological:     Mental Status: He is alert and oriented to person, place, and time.    Assessment and Plan:  [name] is a 38 y.o. male presenting to the [facility] Department for STI screening  1. Screening for venereal disease (Primary)  - Gonococcus culture - Chlamydia/GC NAA, Confirmation - HIV [place] [facility] - Syphilis Serology, [place] [facility]   Patient does not have STI symptoms Patient accepted the following screenings: oral GC culture, urine CT/GC, HIV, and RPR Patient meets criteria for HepB screening? No. Ordered? no Patient meets criteria for HepC screening? No. Ordered? no Recommended condom use with all sex Discussed importance of condom use for STI prevention  Treat positive test results per standing order. Discussed time line for [facility] results and that patient will be called with positive results and encouraged patient to call if he had not heard in 2 weeks Recommended repeat testing in 3 months with positive results. Recommended returning for continued or worsening symptoms.   Return as needed  for STI testing.  Future Appointments  Date Time Provider Department Center  04/01/2025  9:00 AM Ostwalt, Janna, PA-C [facility]    Damien Satchel [facility]  Attestation of Attending Supervision of Advanced Practice Provider (CNM/NP/PA):  Evaluation, management, and procedures were performed by the Advanced Practice Provider under my supervision and collaboration.  I have reviewed the Advanced Practice Provider's note and chart, and I agree with the management and plan.  Dorothyann Helling, MD Clinical Services Medical Director [facility] Department 05/22/24  1:24 PM

## 2024-05-22 NOTE — Progress Notes (Signed)
 Pt is here for std screening. Gaspar Garbe, RN

## 2024-05-25 LAB — CHLAMYDIA/GC NAA, CONFIRMATION
Chlamydia trachomatis, NAA: NEGATIVE
Neisseria gonorrhoeae, NAA: NEGATIVE

## 2024-05-27 LAB — GONOCOCCUS CULTURE

## 2024-05-31 ENCOUNTER — Encounter: Payer: Self-pay | Admitting: Family Medicine

## 2024-06-18 ENCOUNTER — Other Ambulatory Visit: Payer: Self-pay

## 2024-06-18 ENCOUNTER — Encounter: Payer: Self-pay | Admitting: *Deleted

## 2024-06-18 DIAGNOSIS — R208 Other disturbances of skin sensation: Secondary | ICD-10-CM | POA: Insufficient documentation

## 2024-06-18 DIAGNOSIS — Z5321 Procedure and treatment not carried out due to patient leaving prior to being seen by health care provider: Secondary | ICD-10-CM | POA: Diagnosis not present

## 2024-06-18 LAB — CBC
HCT: 43.3 % (ref 39.0–52.0)
Hemoglobin: 14.2 g/dL (ref 13.0–17.0)
MCH: 29.9 pg (ref 26.0–34.0)
MCHC: 32.8 g/dL (ref 30.0–36.0)
MCV: 91.2 fL (ref 80.0–100.0)
Platelets: 262 K/uL (ref 150–400)
RBC: 4.75 MIL/uL (ref 4.22–5.81)
RDW: 12.6 % (ref 11.5–15.5)
WBC: 6.1 K/uL (ref 4.0–10.5)
nRBC: 0 % (ref 0.0–0.2)

## 2024-06-18 LAB — BASIC METABOLIC PANEL WITH GFR
Anion gap: 9 (ref 5–15)
BUN: 15 mg/dL (ref 6–20)
CO2: 27 mmol/L (ref 22–32)
Calcium: 8.9 mg/dL (ref 8.9–10.3)
Chloride: 105 mmol/L (ref 98–111)
Creatinine, Ser: 1.07 mg/dL (ref 0.61–1.24)
GFR, Estimated: >60 mL/min (ref >60)
Glucose, Bld: 84 mg/dL (ref 70–99)
Potassium: 4.1 mmol/L (ref 3.5–5.1)
Sodium: 141 mmol/L (ref 135–145)

## 2024-06-18 NOTE — ED Triage Notes (Signed)
 Pt ambulatory to triage  pt reports left side facial hotness.   Sx began at 1830 today.   No tingling.  No headache  no numbness.  Pt states it feels hot.  Pt was driving and called ems to bring pt to hospital.   No n/v  pt alert  speech clear.

## 2024-06-18 NOTE — ED Triage Notes (Signed)
 Pt arrives via EMS; was enroute from work with c/o feeling hot

## 2024-06-19 ENCOUNTER — Emergency Department
Admission: EM | Admit: 2024-06-19 | Discharge: 2024-06-19 | Discharge status: Against Medical Advice | Attending: Emergency Medicine | Admitting: Emergency Medicine

## 2024-06-20 ENCOUNTER — Encounter: Payer: Self-pay | Admitting: Physician Assistant

## 2024-06-20 ENCOUNTER — Ambulatory Visit (INDEPENDENT_AMBULATORY_CARE_PROVIDER_SITE_OTHER): Admitting: Physician Assistant

## 2024-06-20 VITALS — BP 107/71 | HR 52 | Ht 72.0 in | Wt 175.6 lb

## 2024-06-20 DIAGNOSIS — Z1159 Encounter for screening for other viral diseases: Secondary | ICD-10-CM

## 2024-06-20 DIAGNOSIS — R001 Bradycardia, unspecified: Secondary | ICD-10-CM | POA: Insufficient documentation

## 2024-06-20 DIAGNOSIS — Z136 Encounter for screening for cardiovascular disorders: Secondary | ICD-10-CM

## 2024-06-20 DIAGNOSIS — R0981 Nasal congestion: Secondary | ICD-10-CM | POA: Insufficient documentation

## 2024-06-20 DIAGNOSIS — R42 Dizziness and giddiness: Secondary | ICD-10-CM | POA: Diagnosis not present

## 2024-06-20 DIAGNOSIS — R202 Paresthesia of skin: Secondary | ICD-10-CM | POA: Diagnosis not present

## 2024-06-20 NOTE — Progress Notes (Signed)
 Established patient visit  Patient: Austin Fletcher   DOB: 18-May-1986   38 y.o. Male  MRN: [id number] Visit Date: 06/20/2024  Today's healthcare provider: Jolynn Spencer, PA-C   Chief Complaint  Patient presents with   Acute Visit    Patient is present due to facial hotness. Patient visited ED yesterday but was not seen. He reports feeling heat since Tuesday night but not as bad. He reports no swelling that he has noticed   Subjective     HPI     Acute Visit    Additional comments: Patient is present due to facial hotness. Patient visited ED yesterday but was not seen. He reports feeling heat since Tuesday night but not as bad. He reports no swelling that he has noticed      Last edited by Lilian Fitzpatrick, CMA on 06/20/2024 10:17 AM.       Discussed the use of AI scribe software for clinical note transcription with the patient, who gave verbal consent to proceed.  History of Present Illness Austin Fletcher is a 38 year old male who presents with unilateral facial heat sensation, tightness and dizziness.  He experiences intermittent heat sensation on one side of his face, particularly around the eye and ear, with intensity reaching 6 to 7 out of 10. He denies any new medications or dietary changes. Dizziness episodes led him to leave work early and call an ambulance while driving due to lightheadedness. The dizziness resembles being on a moving platform and is not related to positional changes. There is no recent trauma, alcohol use, or routine changes. Occasional chest pain is present, and he takes baby aspirin  due to a family history of heart issues. He experiences general fatigue and rarely feels refreshed after sleep. There is no weakness, speech difficulty, or changes in taste or smell. Occasionally, he feels a lump in his throat when swallowing solid food. Symptoms are not alleviated by specific actions, but time reduces his intensity.       06/20/2024   10:22 AM 03/28/2024     2:16 PM 03/30/2022    3:13 PM  Depression screen PHQ 2/9  Decreased Interest 1 1 0  Down, Depressed, Hopeless 1 1 0  PHQ - 2 Score 2 2 0  Altered sleeping 1 1   Tired, decreased energy 2 1   Change in appetite 1 1   Feeling bad or failure about yourself  0 1   Trouble concentrating 0 0   Moving slowly or fidgety/restless 0 1   Suicidal thoughts 0 0   PHQ-9 Score 6 7   Difficult doing work/chores Not difficult at all        03/28/2024    2:16 PM  GAD 7 : Generalized Anxiety Score  Nervous, Anxious, on Edge 1  Control/stop worrying 0  Worry too much - different things 1  Trouble relaxing 1  Restless 0  Easily annoyed or irritable 2  Afraid - awful might happen 0  Total GAD 7 Score 5  Anxiety Difficulty Somewhat difficult    Medications: Outpatient Medications Prior to Visit  Medication Sig   aspirin  EC 81 MG tablet Take 81 mg by mouth daily. Swallow whole. (Patient taking differently: Take 81 mg by mouth as needed. Swallow whole.)   cetirizine  (ZYRTEC ) 10 MG tablet Take 1 tablet (10 mg total) by mouth daily. (Patient not taking: Reported on 03/28/2024)   fluocinonide -emollient (LIDEX -E) 0.05 % cream Apply 1 application  topically 2 (two) times daily. (Patient  not taking: Reported on 03/28/2024)   methocarbamol  (ROBAXIN ) 500 MG tablet Take 1 tablet (500 mg total) by mouth 4 (four) times daily. (Patient not taking: Reported on 03/28/2024)   predniSONE  (DELTASONE ) 10 MG tablet Take 1 tablet (10 mg total) by mouth daily with breakfast. (Patient not taking: Reported on 03/28/2024)   No facility-administered medications prior to visit.    Review of Systems All negative Except see HPI       Objective    BP 107/71 (BP Location: Left Arm, Patient Position: Sitting, Cuff Size: Normal)   Pulse (!) 52   Ht 6' (1.829 m)   Wt 175 lb 9.6 oz (79.7 kg)   SpO2 100%   BMI 23.82 kg/m     Physical Exam Vitals reviewed.  Constitutional:      General: He is not in acute distress.     Appearance: Normal appearance. He is not diaphoretic.  HENT:     Head: Normocephalic and atraumatic.  Eyes:     General: No scleral icterus.    Conjunctiva/sclera: Conjunctivae normal.  Cardiovascular:     Rate and Rhythm: Normal rate and regular rhythm.     Pulses: Normal pulses.     Heart sounds: Normal heart sounds. No murmur heard. Pulmonary:     Effort: Pulmonary effort is normal. No respiratory distress.     Breath sounds: Normal breath sounds. No wheezing or rhonchi.  Musculoskeletal:     Cervical back: Neck supple.     Right lower leg: No edema.     Left lower leg: No edema.  Lymphadenopathy:     Cervical: No cervical adenopathy.  Skin:    General: Skin is warm and dry.     Findings: No rash.  Neurological:     Mental Status: He is alert and oriented to person, place, and time. Mental status is at baseline.     Cranial Nerves: No cranial nerve deficit.     Motor: No weakness.     Coordination: Coordination normal.     Gait: Gait normal.     Deep Tendon Reflexes: Reflexes normal.  Psychiatric:        Mood and Affect: Mood normal.        Behavior: Behavior normal.        Thought Content: Thought content normal.        Judgment: Judgment normal.      No results found for any visits on 06/20/24.      Assessment & Plan Unilateral facial heat and tightness with dizziness Intermittent unilateral facial heat and tightness with dizziness for two days. No weakness, speech difficulties, or significant headache. No visual problems, symptoms not positional. No recent viral infections or trauma. Not on any medications or using any recreational drugs. Ddx stroke, migraine, cervical artery dissection - Order CT [facility] of the [facility] at [facility] for urgent evaluation. - Consider further evaluation for sinus-related issues if CT scan is normal. Will follow-up  Sinus congestion Reports sinus congestion with occasional pressure. Occasional difficulty swallowing solids and  lack of refreshed feeling after sleep suggest sinus issues. - Evaluate sinus condition further if CT scan is normal.  Bradycardia Bradycardia with pulse of 52. No syncope. Previous tests for anemia and electrolytes normal. No recent chest pain during dizziness and facial heat. Takes baby aspirin  due to family history of chest pain. - Perform EKG showed sinus bradycardia with arrhythmia.  Ventricular rate of 47 Consider referral to cardiology Consider repeating EKG   No orders  of the defined types were placed in this encounter.   No follow-ups on file.   The patient was advised to call back or seek an in-person evaluation if the symptoms worsen or if the condition fails to improve as anticipated.  I discussed the assessment and treatment plan with the patient. The patient was provided an opportunity to ask questions and all were answered. The patient agreed with the plan and demonstrated an understanding of the instructions.  I, Braxen Dobek, PA-C have reviewed all documentation for this visit. The documentation on 06/20/2024  for the exam, diagnosis, procedures, and orders are all accurate and complete.  Jolynn Spencer, [facility], MMS [facility] [phone] (phone) [phone] (fax)  [facility]

## 2024-06-21 LAB — COMPREHENSIVE METABOLIC PANEL WITH GFR
ALT: 11 IU/L (ref 0–44)
AST: 17 IU/L (ref 0–40)
Albumin: 4.5 g/dL (ref 4.1–5.1)
Alkaline Phosphatase: 76 IU/L (ref 44–121)
BUN/Creatinine Ratio: 12 (ref 9–20)
BUN: 14 mg/dL (ref 6–20)
Bilirubin Total: 0.7 mg/dL (ref 0.0–1.2)
CO2: 23 mmol/L (ref 20–29)
Calcium: 8.9 mg/dL (ref 8.7–10.2)
Chloride: 103 mmol/L (ref 96–106)
Creatinine, Ser: 1.14 mg/dL (ref 0.76–1.27)
Globulin, Total: 2.7 g/dL (ref 1.5–4.5)
Glucose: 84 mg/dL (ref 70–99)
Potassium: 4.6 mmol/L (ref 3.5–5.2)
Sodium: 141 mmol/L (ref 134–144)
Total Protein: 7.2 g/dL (ref 6.0–8.5)
eGFR: 84 mL/min/1.73 (ref >59)

## 2024-06-21 LAB — TROPONIN T: Troponin T (Highly Sensitive): <6 ng/L (ref 0–22)

## 2024-06-21 LAB — LIPID PANEL
Chol/HDL Ratio: 2.9 ratio (ref 0.0–5.0)
Cholesterol, Total: 183 mg/dL (ref 100–199)
HDL: 63 mg/dL (ref >39)
LDL Chol Calc (NIH): 108 mg/dL — ABNORMAL HIGH (ref 0–99)
Triglycerides: 61 mg/dL (ref 0–149)
VLDL Cholesterol Cal: 12 mg/dL (ref 5–40)

## 2024-06-21 LAB — T4 AND TSH
T4, Total: 6.8 ug/dL (ref 4.5–12.0)
TSH: 2.37 u[IU]/mL (ref 0.450–4.500)

## 2024-06-21 LAB — HEPATITIS C ANTIBODY: Hep C Virus Ab: NONREACTIVE

## 2024-06-21 LAB — D-DIMER, QUANTITATIVE: D-DIMER: 0.26 mg{FEU}/L (ref 0.00–0.49)

## 2024-06-27 ENCOUNTER — Ambulatory Visit: Payer: Self-pay | Admitting: Physician Assistant

## 2024-07-02 NOTE — Telephone Encounter (Signed)
 Please, check with pt if he is requesting TSH testing?US  imaging? If yes, It was placed. Please, check with our scheduler regarding a date

## 2024-07-03 NOTE — Addendum Note (Signed)
 Addended by: CHERRY CHIQUITA HERO on: 07/03/2024 11:37 AM   Modules accepted: Orders

## 2024-07-25 ENCOUNTER — Telehealth: Payer: Self-pay

## 2024-07-25 NOTE — Telephone Encounter (Signed)
 Copied from CRM [id number]. Topic: Referral - Question >> Jul 25, 2024  2:52 PM Sophia H wrote: Reason for CRM: Patient is requesting a referral to a thyroid  specialist. States he spoke with her about this a few visits back but never heard anything. Don't see referral in chart? Please advise # [phone]

## 2024-10-07 ENCOUNTER — Other Ambulatory Visit: Payer: Self-pay | Admitting: Internal Medicine

## 2024-10-07 DIAGNOSIS — R42 Dizziness and giddiness: Secondary | ICD-10-CM

## 2024-10-07 DIAGNOSIS — R002 Palpitations: Secondary | ICD-10-CM

## 2024-10-11 ENCOUNTER — Other Ambulatory Visit: Payer: Self-pay | Admitting: Internal Medicine

## 2024-10-11 DIAGNOSIS — R002 Palpitations: Secondary | ICD-10-CM

## 2024-10-11 DIAGNOSIS — R42 Dizziness and giddiness: Secondary | ICD-10-CM

## 2024-10-18 ENCOUNTER — Other Ambulatory Visit

## 2024-11-01 ENCOUNTER — Other Ambulatory Visit: Payer: Self-pay | Admitting: Neurology

## 2024-11-01 DIAGNOSIS — R42 Dizziness and giddiness: Secondary | ICD-10-CM

## 2024-11-01 DIAGNOSIS — R519 Headache, unspecified: Secondary | ICD-10-CM

## 2024-11-04 ENCOUNTER — Encounter (HOSPITAL_COMMUNITY): Payer: Self-pay

## 2024-11-06 ENCOUNTER — Ambulatory Visit
Admission: RE | Admit: 2024-11-06 | Discharge: 2024-11-06 | Disposition: A | Discharge status: Home / Self Care | Source: Ambulatory Visit | Attending: Internal Medicine | Admitting: Internal Medicine

## 2024-11-06 ENCOUNTER — Ambulatory Visit
Admission: RE | Admit: 2024-11-06 | Discharge: 2024-11-06 | Disposition: A | Discharge status: Home / Self Care | Source: Ambulatory Visit | Attending: Neurology | Admitting: Neurology

## 2024-11-06 ENCOUNTER — Other Ambulatory Visit: Payer: Self-pay | Admitting: Internal Medicine

## 2024-11-06 DIAGNOSIS — R519 Headache, unspecified: Secondary | ICD-10-CM | POA: Insufficient documentation

## 2024-11-06 DIAGNOSIS — R42 Dizziness and giddiness: Secondary | ICD-10-CM

## 2024-11-06 DIAGNOSIS — R002 Palpitations: Secondary | ICD-10-CM | POA: Insufficient documentation

## 2024-11-06 MED ORDER — GADOBUTROL 1 MMOL/ML IV SOLN: 13.0000 mL | Freq: Once | INTRAVENOUS | Status: DC | PRN

## 2024-11-06 MED ORDER — GADOBUTROL 1 MMOL/ML IV SOLN
10.0000 mL | Freq: Once | INTRAVENOUS | Status: AC | PRN
  Administered 2024-11-06: 10 mL via INTRAVENOUS

## 2024-11-08 ENCOUNTER — Ambulatory Visit: Attending: Internal Medicine

## 2024-11-20 ENCOUNTER — Other Ambulatory Visit

## 2024-11-28 ENCOUNTER — Ambulatory Visit: Admitting: Physician Assistant

## 2024-12-03 ENCOUNTER — Other Ambulatory Visit

## 2025-01-07 ENCOUNTER — Ambulatory Visit: Admitting: Physician Assistant

## 2025-04-01 ENCOUNTER — Encounter: Admitting: Physician Assistant
